# Patient Record
Sex: Female | Born: 1935
Health system: Southern US, Community
[De-identification: ages and names within clinical notes are randomized; demographics above are authoritative.]

## PROBLEM LIST (undated history)

## (undated) DIAGNOSIS — T8859XA Other complications of anesthesia, initial encounter: Secondary | ICD-10-CM

## (undated) DIAGNOSIS — I10 Essential (primary) hypertension: Secondary | ICD-10-CM

## (undated) DIAGNOSIS — M1990 Unspecified osteoarthritis, unspecified site: Secondary | ICD-10-CM

## (undated) DIAGNOSIS — T4145XA Adverse effect of unspecified anesthetic, initial encounter: Secondary | ICD-10-CM

## (undated) DIAGNOSIS — E785 Hyperlipidemia, unspecified: Secondary | ICD-10-CM

## (undated) DIAGNOSIS — I6529 Occlusion and stenosis of unspecified carotid artery: Secondary | ICD-10-CM

## (undated) HISTORY — PX: BACK SURGERY: SHX140

## (undated) HISTORY — DX: Essential (primary) hypertension: I10

## (undated) HISTORY — DX: Hyperlipidemia, unspecified: E78.5

## (undated) HISTORY — PX: CAROTID STENT: SHX1301

## (undated) HISTORY — PX: TOTAL SHOULDER REPLACEMENT: SUR1217

## (undated) HISTORY — DX: Occlusion and stenosis of unspecified carotid artery: I65.29

## (undated) HISTORY — PX: TONSILLECTOMY: SUR1361

## (undated) HISTORY — PX: JOINT REPLACEMENT: SHX530

## (undated) HISTORY — PX: REPLACEMENT TOTAL HIP W/  RESURFACING IMPLANTS: SUR1222

## (undated) HISTORY — PX: CATARACT EXTRACTION, BILATERAL: SHX1313

---

## 1998-05-29 ENCOUNTER — Ambulatory Visit (HOSPITAL_COMMUNITY): Admission: RE | Admit: 1998-05-29 | Discharge: 1998-05-29 | Payer: Self-pay | Admitting: Neurosurgery

## 2004-08-13 ENCOUNTER — Other Ambulatory Visit: Payer: Self-pay

## 2004-08-13 ENCOUNTER — Inpatient Hospital Stay: Payer: Self-pay | Admitting: Internal Medicine

## 2004-10-07 ENCOUNTER — Ambulatory Visit: Payer: Self-pay | Admitting: Internal Medicine

## 2004-12-15 ENCOUNTER — Ambulatory Visit: Payer: Self-pay | Admitting: Internal Medicine

## 2005-05-11 ENCOUNTER — Ambulatory Visit: Payer: Self-pay | Admitting: Internal Medicine

## 2005-05-18 ENCOUNTER — Ambulatory Visit: Payer: Self-pay | Admitting: Internal Medicine

## 2005-11-16 ENCOUNTER — Ambulatory Visit: Payer: Self-pay | Admitting: Unknown Physician Specialty

## 2005-12-27 ENCOUNTER — Ambulatory Visit: Payer: Self-pay | Admitting: Internal Medicine

## 2007-01-11 ENCOUNTER — Ambulatory Visit: Payer: Self-pay | Admitting: Internal Medicine

## 2007-05-23 ENCOUNTER — Ambulatory Visit: Payer: Self-pay | Admitting: Internal Medicine

## 2007-06-06 ENCOUNTER — Ambulatory Visit: Payer: Self-pay | Admitting: Surgery

## 2007-06-12 ENCOUNTER — Inpatient Hospital Stay: Payer: Self-pay | Admitting: Surgery

## 2007-07-21 ENCOUNTER — Ambulatory Visit: Payer: Self-pay | Admitting: Specialist

## 2007-07-27 ENCOUNTER — Ambulatory Visit: Payer: Self-pay | Admitting: Specialist

## 2007-08-16 ENCOUNTER — Ambulatory Visit: Payer: Self-pay | Admitting: Specialist

## 2008-01-17 ENCOUNTER — Ambulatory Visit: Payer: Self-pay | Admitting: Internal Medicine

## 2009-08-29 ENCOUNTER — Inpatient Hospital Stay: Payer: Self-pay | Admitting: Orthopedic Surgery

## 2009-09-04 ENCOUNTER — Encounter: Payer: Self-pay | Admitting: Internal Medicine

## 2009-12-28 ENCOUNTER — Ambulatory Visit: Payer: Self-pay | Admitting: Orthopedic Surgery

## 2010-03-11 ENCOUNTER — Ambulatory Visit (HOSPITAL_COMMUNITY): Admission: RE | Admit: 2010-03-11 | Discharge: 2010-03-12 | Payer: Self-pay | Admitting: Orthopedic Surgery

## 2010-03-15 ENCOUNTER — Inpatient Hospital Stay: Payer: Self-pay | Admitting: Orthopedic Surgery

## 2010-03-20 ENCOUNTER — Encounter: Payer: Self-pay | Admitting: Internal Medicine

## 2010-04-02 ENCOUNTER — Encounter: Payer: Self-pay | Admitting: Internal Medicine

## 2010-12-20 LAB — URINE MICROSCOPIC-ADD ON

## 2010-12-20 LAB — URINALYSIS, ROUTINE W REFLEX MICROSCOPIC
Bilirubin Urine: NEGATIVE
Glucose, UA: NEGATIVE mg/dL
Hgb urine dipstick: NEGATIVE
Ketones, ur: NEGATIVE mg/dL
Nitrite: POSITIVE — AB
Protein, ur: NEGATIVE mg/dL
Specific Gravity, Urine: 1.016 (ref 1.005–1.030)
Urobilinogen, UA: 0.2 mg/dL (ref 0.0–1.0)
pH: 6.5 (ref 5.0–8.0)

## 2010-12-20 LAB — COMPREHENSIVE METABOLIC PANEL
ALT: 19 U/L (ref 0–35)
AST: 24 U/L (ref 0–37)
Albumin: 4.1 g/dL (ref 3.5–5.2)
Alkaline Phosphatase: 93 U/L (ref 39–117)
BUN: 15 mg/dL (ref 6–23)
CO2: 28 mEq/L (ref 19–32)
Calcium: 9.7 mg/dL (ref 8.4–10.5)
Chloride: 103 mEq/L (ref 96–112)
Creatinine, Ser: 0.66 mg/dL (ref 0.4–1.2)
GFR calc Af Amer: >60 mL/min (ref >60)
GFR calc non Af Amer: >60 mL/min (ref >60)
Glucose, Bld: 102 mg/dL — ABNORMAL HIGH (ref 70–99)
Potassium: 4.3 mEq/L (ref 3.5–5.1)
Sodium: 137 mEq/L (ref 135–145)
Total Bilirubin: 0.4 mg/dL (ref 0.3–1.2)
Total Protein: 7.4 g/dL (ref 6.0–8.3)

## 2010-12-20 LAB — CBC
HCT: 43.2 % (ref 36.0–46.0)
Hemoglobin: 14.3 g/dL (ref 12.0–15.0)
MCHC: 33.1 g/dL (ref 30.0–36.0)
MCV: 87.9 fL (ref 78.0–100.0)
Platelets: 246 10*3/uL (ref 150–400)
RBC: 4.91 MIL/uL (ref 3.87–5.11)
RDW: 14.7 % (ref 11.5–15.5)
WBC: 9.5 10*3/uL (ref 4.0–10.5)

## 2010-12-20 LAB — APTT: aPTT: 26 seconds (ref 24–37)

## 2010-12-20 LAB — PROTIME-INR
INR: 1.01 (ref 0.00–1.49)
Prothrombin Time: 13.2 seconds (ref 11.6–15.2)

## 2011-03-27 ENCOUNTER — Ambulatory Visit: Payer: Self-pay | Admitting: Orthopedic Surgery

## 2011-04-07 ENCOUNTER — Ambulatory Visit: Payer: Self-pay | Admitting: Oncology

## 2011-04-08 LAB — CEA: CEA: 1.4 ng/mL (ref 0.0–4.7)

## 2011-04-11 LAB — PROT IMMUNOELECTROPHORES(ARMC)

## 2011-05-04 ENCOUNTER — Ambulatory Visit: Payer: Self-pay | Admitting: Oncology

## 2015-10-28 DIAGNOSIS — I10 Essential (primary) hypertension: Secondary | ICD-10-CM | POA: Diagnosis not present

## 2015-10-28 DIAGNOSIS — E785 Hyperlipidemia, unspecified: Secondary | ICD-10-CM | POA: Diagnosis not present

## 2015-11-03 DIAGNOSIS — I10 Essential (primary) hypertension: Secondary | ICD-10-CM | POA: Diagnosis not present

## 2015-11-03 DIAGNOSIS — E785 Hyperlipidemia, unspecified: Secondary | ICD-10-CM | POA: Diagnosis not present

## 2015-11-25 DIAGNOSIS — H401192 Primary open-angle glaucoma, unspecified eye, moderate stage: Secondary | ICD-10-CM | POA: Diagnosis not present

## 2015-12-02 DIAGNOSIS — H401132 Primary open-angle glaucoma, bilateral, moderate stage: Secondary | ICD-10-CM | POA: Diagnosis not present

## 2016-03-08 DIAGNOSIS — E785 Hyperlipidemia, unspecified: Secondary | ICD-10-CM | POA: Diagnosis not present

## 2016-03-08 DIAGNOSIS — I10 Essential (primary) hypertension: Secondary | ICD-10-CM | POA: Diagnosis not present

## 2016-05-10 DIAGNOSIS — I25118 Atherosclerotic heart disease of native coronary artery with other forms of angina pectoris: Secondary | ICD-10-CM | POA: Diagnosis not present

## 2016-05-10 DIAGNOSIS — I10 Essential (primary) hypertension: Secondary | ICD-10-CM | POA: Diagnosis not present

## 2016-05-10 DIAGNOSIS — R42 Dizziness and giddiness: Secondary | ICD-10-CM | POA: Diagnosis not present

## 2016-05-10 DIAGNOSIS — I6523 Occlusion and stenosis of bilateral carotid arteries: Secondary | ICD-10-CM | POA: Diagnosis not present

## 2016-05-10 DIAGNOSIS — I2510 Atherosclerotic heart disease of native coronary artery without angina pectoris: Secondary | ICD-10-CM | POA: Insufficient documentation

## 2016-05-11 DIAGNOSIS — H401132 Primary open-angle glaucoma, bilateral, moderate stage: Secondary | ICD-10-CM | POA: Diagnosis not present

## 2016-05-16 DIAGNOSIS — R002 Palpitations: Secondary | ICD-10-CM | POA: Diagnosis not present

## 2016-06-02 DIAGNOSIS — I25118 Atherosclerotic heart disease of native coronary artery with other forms of angina pectoris: Secondary | ICD-10-CM | POA: Diagnosis not present

## 2016-06-02 DIAGNOSIS — I6523 Occlusion and stenosis of bilateral carotid arteries: Secondary | ICD-10-CM | POA: Diagnosis not present

## 2016-06-08 DIAGNOSIS — I6523 Occlusion and stenosis of bilateral carotid arteries: Secondary | ICD-10-CM | POA: Diagnosis not present

## 2016-06-08 DIAGNOSIS — I10 Essential (primary) hypertension: Secondary | ICD-10-CM | POA: Diagnosis not present

## 2016-06-08 DIAGNOSIS — I2510 Atherosclerotic heart disease of native coronary artery without angina pectoris: Secondary | ICD-10-CM | POA: Diagnosis not present

## 2016-06-24 DIAGNOSIS — I10 Essential (primary) hypertension: Secondary | ICD-10-CM | POA: Diagnosis not present

## 2016-06-24 DIAGNOSIS — E785 Hyperlipidemia, unspecified: Secondary | ICD-10-CM | POA: Diagnosis not present

## 2016-06-24 DIAGNOSIS — Z23 Encounter for immunization: Secondary | ICD-10-CM | POA: Diagnosis not present

## 2016-07-12 DIAGNOSIS — E785 Hyperlipidemia, unspecified: Secondary | ICD-10-CM | POA: Diagnosis not present

## 2016-07-12 DIAGNOSIS — I10 Essential (primary) hypertension: Secondary | ICD-10-CM | POA: Diagnosis not present

## 2016-10-11 DIAGNOSIS — K2970 Gastritis, unspecified, without bleeding: Secondary | ICD-10-CM | POA: Diagnosis not present

## 2016-12-07 DIAGNOSIS — E785 Hyperlipidemia, unspecified: Secondary | ICD-10-CM | POA: Diagnosis not present

## 2016-12-07 DIAGNOSIS — I10 Essential (primary) hypertension: Secondary | ICD-10-CM | POA: Diagnosis not present

## 2016-12-22 DIAGNOSIS — I6523 Occlusion and stenosis of bilateral carotid arteries: Secondary | ICD-10-CM | POA: Diagnosis not present

## 2016-12-22 DIAGNOSIS — E782 Mixed hyperlipidemia: Secondary | ICD-10-CM | POA: Diagnosis not present

## 2016-12-22 DIAGNOSIS — I10 Essential (primary) hypertension: Secondary | ICD-10-CM | POA: Diagnosis not present

## 2016-12-22 DIAGNOSIS — I2510 Atherosclerotic heart disease of native coronary artery without angina pectoris: Secondary | ICD-10-CM | POA: Diagnosis not present

## 2017-01-03 DIAGNOSIS — E785 Hyperlipidemia, unspecified: Secondary | ICD-10-CM | POA: Diagnosis not present

## 2017-01-03 DIAGNOSIS — I10 Essential (primary) hypertension: Secondary | ICD-10-CM | POA: Diagnosis not present

## 2017-01-10 DIAGNOSIS — H401132 Primary open-angle glaucoma, bilateral, moderate stage: Secondary | ICD-10-CM | POA: Diagnosis not present

## 2017-01-17 DIAGNOSIS — H401132 Primary open-angle glaucoma, bilateral, moderate stage: Secondary | ICD-10-CM | POA: Diagnosis not present

## 2017-06-07 DIAGNOSIS — E785 Hyperlipidemia, unspecified: Secondary | ICD-10-CM | POA: Diagnosis not present

## 2017-06-07 DIAGNOSIS — I10 Essential (primary) hypertension: Secondary | ICD-10-CM | POA: Diagnosis not present

## 2017-06-20 DIAGNOSIS — I10 Essential (primary) hypertension: Secondary | ICD-10-CM | POA: Diagnosis not present

## 2017-06-20 DIAGNOSIS — E782 Mixed hyperlipidemia: Secondary | ICD-10-CM | POA: Diagnosis not present

## 2017-06-20 DIAGNOSIS — I2510 Atherosclerotic heart disease of native coronary artery without angina pectoris: Secondary | ICD-10-CM | POA: Diagnosis not present

## 2017-06-20 DIAGNOSIS — I6523 Occlusion and stenosis of bilateral carotid arteries: Secondary | ICD-10-CM | POA: Diagnosis not present

## 2017-06-27 DIAGNOSIS — I10 Essential (primary) hypertension: Secondary | ICD-10-CM | POA: Diagnosis not present

## 2017-06-27 DIAGNOSIS — M5134 Other intervertebral disc degeneration, thoracic region: Secondary | ICD-10-CM | POA: Diagnosis not present

## 2017-06-27 DIAGNOSIS — Z23 Encounter for immunization: Secondary | ICD-10-CM | POA: Diagnosis not present

## 2017-06-27 DIAGNOSIS — E785 Hyperlipidemia, unspecified: Secondary | ICD-10-CM | POA: Diagnosis not present

## 2017-09-19 DIAGNOSIS — H401132 Primary open-angle glaucoma, bilateral, moderate stage: Secondary | ICD-10-CM | POA: Diagnosis not present

## 2017-11-29 DIAGNOSIS — E785 Hyperlipidemia, unspecified: Secondary | ICD-10-CM | POA: Diagnosis not present

## 2017-11-29 DIAGNOSIS — I10 Essential (primary) hypertension: Secondary | ICD-10-CM | POA: Diagnosis not present

## 2017-12-05 DIAGNOSIS — E785 Hyperlipidemia, unspecified: Secondary | ICD-10-CM | POA: Diagnosis not present

## 2017-12-05 DIAGNOSIS — M5134 Other intervertebral disc degeneration, thoracic region: Secondary | ICD-10-CM | POA: Diagnosis not present

## 2017-12-05 DIAGNOSIS — I10 Essential (primary) hypertension: Secondary | ICD-10-CM | POA: Diagnosis not present

## 2017-12-26 DIAGNOSIS — I10 Essential (primary) hypertension: Secondary | ICD-10-CM | POA: Diagnosis not present

## 2017-12-26 DIAGNOSIS — I6523 Occlusion and stenosis of bilateral carotid arteries: Secondary | ICD-10-CM | POA: Diagnosis not present

## 2017-12-26 DIAGNOSIS — I2510 Atherosclerotic heart disease of native coronary artery without angina pectoris: Secondary | ICD-10-CM | POA: Diagnosis not present

## 2017-12-26 DIAGNOSIS — E782 Mixed hyperlipidemia: Secondary | ICD-10-CM | POA: Diagnosis not present

## 2017-12-26 DIAGNOSIS — R42 Dizziness and giddiness: Secondary | ICD-10-CM | POA: Diagnosis not present

## 2018-01-29 DIAGNOSIS — N390 Urinary tract infection, site not specified: Secondary | ICD-10-CM | POA: Diagnosis not present

## 2018-02-06 DIAGNOSIS — I2510 Atherosclerotic heart disease of native coronary artery without angina pectoris: Secondary | ICD-10-CM | POA: Diagnosis not present

## 2018-02-06 DIAGNOSIS — I6523 Occlusion and stenosis of bilateral carotid arteries: Secondary | ICD-10-CM | POA: Diagnosis not present

## 2018-02-06 DIAGNOSIS — H401132 Primary open-angle glaucoma, bilateral, moderate stage: Secondary | ICD-10-CM | POA: Diagnosis not present

## 2018-02-06 DIAGNOSIS — I10 Essential (primary) hypertension: Secondary | ICD-10-CM | POA: Diagnosis not present

## 2018-02-06 DIAGNOSIS — R42 Dizziness and giddiness: Secondary | ICD-10-CM | POA: Diagnosis not present

## 2018-02-20 DIAGNOSIS — H401132 Primary open-angle glaucoma, bilateral, moderate stage: Secondary | ICD-10-CM | POA: Diagnosis not present

## 2018-03-08 ENCOUNTER — Encounter (INDEPENDENT_AMBULATORY_CARE_PROVIDER_SITE_OTHER): Payer: Self-pay | Admitting: Vascular Surgery

## 2018-03-08 ENCOUNTER — Ambulatory Visit (INDEPENDENT_AMBULATORY_CARE_PROVIDER_SITE_OTHER): Payer: PPO | Admitting: Vascular Surgery

## 2018-03-08 DIAGNOSIS — I10 Essential (primary) hypertension: Secondary | ICD-10-CM | POA: Diagnosis not present

## 2018-03-08 DIAGNOSIS — I6523 Occlusion and stenosis of bilateral carotid arteries: Secondary | ICD-10-CM

## 2018-03-08 DIAGNOSIS — E785 Hyperlipidemia, unspecified: Secondary | ICD-10-CM | POA: Insufficient documentation

## 2018-03-08 DIAGNOSIS — I2510 Atherosclerotic heart disease of native coronary artery without angina pectoris: Secondary | ICD-10-CM | POA: Insufficient documentation

## 2018-03-08 DIAGNOSIS — I25118 Atherosclerotic heart disease of native coronary artery with other forms of angina pectoris: Secondary | ICD-10-CM | POA: Diagnosis not present

## 2018-03-08 DIAGNOSIS — I6529 Occlusion and stenosis of unspecified carotid artery: Secondary | ICD-10-CM | POA: Insufficient documentation

## 2018-03-08 DIAGNOSIS — E782 Mixed hyperlipidemia: Secondary | ICD-10-CM | POA: Diagnosis not present

## 2018-03-08 NOTE — Progress Notes (Signed)
MRN : [id number]  Cindy Mcdonald is a 84 y.o. (Apr 04, 1930) female who presents with chief complaint of  Chief Complaint  Patient presents with  . New Patient (Initial Visit)    CAD  .  History of Present Illness:   The patient is seen for evaluation of carotid stenosis. The carotid stenosis was identified years ago and has been follow by duplex ultrasound.  She is s/p remote right CEA which was without any significant complications.  Her most recent duplex now shows a >80% lesion of the left ICA.  The patient denies amaurosis fugax. There is no recent history of TIA symptoms or focal motor deficits. There is no prior documented CVA.  There is no history of migraine headaches. There is no history of seizures.  The patient is taking enteric-coated aspirin 81 mg daily.  The patient has a history of coronary artery disease, no recent episodes of angina or shortness of breath. The patient denies PAD or claudication symptoms. There is a history of hyperlipidemia which is being treated with a statin.    Current Meds  Medication Sig  . amLODipine (NORVASC) 5 MG tablet TAKE ONE TABLET EVERY DAY  . aspirin EC 81 MG tablet Take by mouth.  . dorzolamide-timolol (COSOPT) 22.3-6.8 MG/ML ophthalmic solution INSTILL 1 DROP TO BOTH EYES 2 TIMES A DAY  . Garlic 4742 MG CAPS Take by mouth.  . latanoprost (XALATAN) 0.005 % ophthalmic solution   . losartan (COZAAR) 25 MG tablet Take by mouth.  . niacin 500 MG tablet Take 500 mg by mouth at bedtime.  . Omega-3 Fatty Acids (FISH OIL) 1000 MG CPDR Take by mouth.    Past Medical History:  Diagnosis Date  . Carotid artery occlusion   . Carotid artery occlusion   . Hyperlipidemia   . Hypertension      Social History Social History   Tobacco Use  . Smoking status: Never Smoker  . Smokeless tobacco: Never Used  Substance Use Topics  . Alcohol use: Not Currently  . Drug use: Not on file    Family History Family History  Problem  Relation Age of Onset  . Hypertension Mother   . Heart disease Father   No family history of bleeding/clotting disorders, porphyria or autoimmune disease   Allergies  Allergen Reactions  . Codeine     Other reaction(s): Unknown  . Nsaids     Other reaction(s): Unknown  . Statins     Other reaction(s): Unknown     REVIEW OF SYSTEMS (Negative unless checked)  Constitutional: '[]' Weight loss  '[]' Fever  '[]' Chills Cardiac: '[]' Chest pain   '[]' Chest pressure   '[]' Palpitations   '[]' Shortness of breath when laying flat   '[]' Shortness of breath with exertion. Vascular:  '[]' Pain in legs with walking   '[]' Pain in legs at rest  '[]' History of DVT   '[]' Phlebitis   '[]' Swelling in legs   '[]' Varicose veins   '[]' Non-healing ulcers Pulmonary:   '[]' Uses home oxygen   '[]' Productive cough   '[]' Hemoptysis   '[]' Wheeze  '[]' COPD   '[]' Asthma Neurologic:  '[x]' Dizziness   '[]' Seizures   '[]' History of stroke   '[]' History of TIA  '[]' Aphasia   '[]' Vissual changes   '[]' Weakness or numbness in arm   '[]' Weakness or numbness in leg Musculoskeletal:   '[]' Joint swelling   '[]' Joint pain   '[]' Low back pain Hematologic:  '[]' Easy bruising  '[]' Easy bleeding   '[]' Hypercoagulable state   '[]' Anemic Gastrointestinal:  '[]' Diarrhea   '[]' Vomiting  '[]' Gastroesophageal reflux/heartburn   '[]'   Difficulty swallowing. Genitourinary:  '[]' Chronic kidney disease   '[]' Difficult urination  '[]' Frequent urination   '[]' Blood in urine Skin:  '[]' Rashes   '[]' Ulcers  Psychological:  '[]' History of anxiety   '[]'  History of major depression.  Physical Examination  Vitals:   03/08/18 1308  BP: (!) 157/100  Pulse: 84  Resp: 15  Weight: 129 lb (58.5 kg)  Height: '5\' 4"'  (1.626 m)   Body mass index is 22.14 kg/m. Gen: WD/WN, NAD Head: [place]/AT, No temporalis wasting.  Ear/Nose/Throat: Hearing grossly intact, nares w/o erythema or drainage, poor dentition Eyes: PER, EOMI, sclera nonicteric.  Neck: Supple, no masses.  No bruit or JVD.  Pulmonary:  Good air movement, clear to auscultation bilaterally,  no use of accessory muscles.  Cardiac: RRR, normal S1, S2, no Murmurs. Vascular: right CEA incisional scar well healed.  Bilateral carotid bruits Vessel Right Left  Radial Palpable Palpable  Ulnar Palpable Palpable  Brachial Palpable Palpable  Carotid Palpable Palpable  Gastrointestinal: soft, non-distended. No guarding/no peritoneal signs.  Musculoskeletal: M/S 5/5 throughout.  No deformity or atrophy.  Neurologic: CN 2-12 intact. Pain and light touch intact in extremities.  Symmetrical.  Speech is fluent. Motor exam as listed above. Psychiatric: Judgment intact, Mood & affect appropriate for pt's clinical situation. Dermatologic: No rashes or ulcers noted.  No changes consistent with cellulitis. Lymph : No Cervical lymphadenopathy, no lichenification or skin changes of chronic lymphedema.  CBC Lab Results  Component Value Date   WBC 9.5 03/08/2010   HGB 14.3 03/08/2010   HCT 43.2 03/08/2010   MCV 87.9 03/08/2010   PLT 246 03/08/2010    BMET    Component Value Date/Time   NA 137 03/08/2010 1054   K 4.3 03/08/2010 1054   CL 103 03/08/2010 1054   CO2 28 03/08/2010 1054   GLUCOSE 102 (H) 03/08/2010 1054   BUN 15 03/08/2010 1054   CREATININE 0.66 03/08/2010 1054   CALCIUM 9.7 03/08/2010 1054   GFRNONAA >60 03/08/2010 1054   GFRAA  03/08/2010 1054    >60        The eGFR has been calculated using the MDRD equation. This calculation has not been validated in all clinical situations. eGFR's persistently <60 mL/min signify possible Chronic Kidney Disease.   CrCl cannot be calculated (Patient's most recent lab result is older than the maximum 21 days allowed.).  COAG Lab Results  Component Value Date   INR 1.01 03/08/2010    Radiology No results found.    Assessment/Plan 1. Bilateral carotid artery stenosis The patient remains asymptomatic with respect to the carotid stenosis.  However, the patient has now progressed and has a LICA lesion the is >90%.  Patient  should undergo CT angiography of the carotid arteries to define the degree of stenosis of the internal carotid arteries bilaterally and the anatomic suitability for surgery vs. intervention.  If the patient does indeed need surgery cardiac clearance will be required, once cleared the patient will be scheduled for surgery.  The risks, benefits and alternative therapies were reviewed in detail with the patient.  All questions were answered.  The patient agrees to proceed with imaging.  Continue antiplatelet therapy as prescribed. Continue management of CAD, HTN and Hyperlipidemia. Healthy heart diet, encouraged exercise at least 4 times per week.   - CT ANGIO NECK W OR WO CONTRAST; Future  2. Essential hypertension Continue antihypertensive medications as already ordered, these medications have been reviewed and there are no changes at this time.   3.  Coronary artery disease of native artery of native heart with stable angina pectoris (HCC) Continue cardiac and antihypertensive medications as already ordered and reviewed, no changes at this time.  Continue statin as ordered and reviewed, no changes at this time  Nitrates PRN for chest pain   4. Mixed hyperlipidemia Continue statin as ordered and reviewed, no changes at this time     Hortencia Pilar, MD  03/08/2018 1:28 PM

## 2018-03-22 ENCOUNTER — Ambulatory Visit
Admission: RE | Admit: 2018-03-22 | Discharge: 2018-03-22 | Disposition: A | Discharge status: Home / Self Care | Payer: PPO | Source: Ambulatory Visit | Attending: Vascular Surgery | Admitting: Vascular Surgery

## 2018-03-22 DIAGNOSIS — I700 Atherosclerosis of aorta: Secondary | ICD-10-CM | POA: Diagnosis not present

## 2018-03-22 DIAGNOSIS — I6522 Occlusion and stenosis of left carotid artery: Secondary | ICD-10-CM | POA: Diagnosis not present

## 2018-03-22 DIAGNOSIS — I6523 Occlusion and stenosis of bilateral carotid arteries: Secondary | ICD-10-CM | POA: Insufficient documentation

## 2018-03-22 LAB — POCT I-STAT CREATININE: Creatinine, Ser: 1 mg/dL (ref 0.44–1.00)

## 2018-03-22 MED ORDER — IOPAMIDOL (ISOVUE-370) INJECTION 76%
75.0000 mL | Freq: Once | INTRAVENOUS | Status: AC | PRN
  Administered 2018-03-22: 75 mL via INTRAVENOUS

## 2018-04-16 ENCOUNTER — Ambulatory Visit (INDEPENDENT_AMBULATORY_CARE_PROVIDER_SITE_OTHER): Payer: PPO | Admitting: Vascular Surgery

## 2018-04-16 ENCOUNTER — Encounter (INDEPENDENT_AMBULATORY_CARE_PROVIDER_SITE_OTHER): Payer: Self-pay | Admitting: Vascular Surgery

## 2018-04-16 VITALS — BP 152/78 | HR 83 | Resp 16 | Ht 64.0 in | Wt 132.0 lb

## 2018-04-16 DIAGNOSIS — E782 Mixed hyperlipidemia: Secondary | ICD-10-CM

## 2018-04-16 DIAGNOSIS — I25118 Atherosclerotic heart disease of native coronary artery with other forms of angina pectoris: Secondary | ICD-10-CM | POA: Diagnosis not present

## 2018-04-16 DIAGNOSIS — I6523 Occlusion and stenosis of bilateral carotid arteries: Secondary | ICD-10-CM | POA: Diagnosis not present

## 2018-04-16 DIAGNOSIS — I10 Essential (primary) hypertension: Secondary | ICD-10-CM | POA: Diagnosis not present

## 2018-04-18 ENCOUNTER — Encounter (INDEPENDENT_AMBULATORY_CARE_PROVIDER_SITE_OTHER): Payer: Self-pay

## 2018-04-18 ENCOUNTER — Encounter (INDEPENDENT_AMBULATORY_CARE_PROVIDER_SITE_OTHER): Payer: Self-pay | Admitting: Vascular Surgery

## 2018-04-18 ENCOUNTER — Telehealth (INDEPENDENT_AMBULATORY_CARE_PROVIDER_SITE_OTHER): Payer: Self-pay

## 2018-04-18 NOTE — Telephone Encounter (Signed)
Spoke with patient's daughter and she is scheduled for her surgery.

## 2018-04-18 NOTE — Progress Notes (Signed)
MRN : [id number]  Cindy Mcdonald is a 84 y.o. ([birth year]) female who presents with chief complaint of  Chief Complaint  Patient presents with  . Follow-up    ct results  .  History of Present Illness: The patient is seen for follow up evaluation of carotid stenosis status post CT angiogram. CT scan was done 03/22/2018. Patient reports that the test went well with no problems or complications.   The patient denies interval amaurosis fugax. There is no recent or interval TIA symptoms or focal motor deficits. There is no prior documented CVA.  The patient is taking enteric-coated aspirin 81 mg daily.  There is no history of migraine headaches. There is no history of seizures.  The patient has a history of coronary artery disease, no recent episodes of angina or shortness of breath. The patient denies PAD or claudication symptoms. There is a history of hyperlipidemia which is being treated with a statin.    CT angiogram is reviewed by me personally and shows greater that 90% left internal carotid artery stenosis and a widely patent right carotid system (she has a history of right carotid endarterectomy remotely) vertebral arteries are widely patent.    Current Meds  Medication Sig  . amLODipine (NORVASC) 5 MG tablet TAKE ONE TABLET EVERY DAY  . aspirin EC 81 MG tablet Take by mouth.  . dorzolamide-timolol (COSOPT) 22.3-6.8 MG/ML ophthalmic solution INSTILL 1 DROP TO BOTH EYES 2 TIMES A DAY  . Garlic 6269 MG CAPS Take by mouth.  . latanoprost (XALATAN) 0.005 % ophthalmic solution   . losartan (COZAAR) 25 MG tablet Take by mouth.  . niacin 500 MG tablet Take 500 mg by mouth at bedtime.  . Omega-3 Fatty Acids (FISH OIL) 1000 MG CPDR Take by mouth.    Past Medical History:  Diagnosis Date  . Carotid artery occlusion   . Carotid artery occlusion   . Hyperlipidemia   . Hypertension     Past Surgical History:  Procedure Laterality Date  . CAROTID STENT    . REPLACEMENT TOTAL  HIP W/  RESURFACING IMPLANTS     times two  . TOTAL SHOULDER REPLACEMENT Right     Social History Social History   Tobacco Use  . Smoking status: Never Smoker  . Smokeless tobacco: Never Used  Substance Use Topics  . Alcohol use: Not Currently  . Drug use: Not on file    Family History Family History  Problem Relation Age of Onset  . Hypertension Mother   . Heart disease Father     Allergies  Allergen Reactions  . Codeine     Other reaction(s): Unknown  . Nsaids     Other reaction(s): Unknown  . Statins     Other reaction(s): Unknown     REVIEW OF SYSTEMS (Negative unless checked)  Constitutional: '[]' Weight loss  '[]' Fever  '[]' Chills Cardiac: '[]' Chest pain   '[]' Chest pressure   '[]' Palpitations   '[]' Shortness of breath when laying flat   '[]' Shortness of breath with exertion. Vascular:  '[]' Pain in legs with walking   '[]' Pain in legs at rest  '[]' History of DVT   '[]' Phlebitis   '[]' Swelling in legs   '[]' Varicose veins   '[]' Non-healing ulcers Pulmonary:   '[]' Uses home oxygen   '[]' Productive cough   '[]' Hemoptysis   '[]' Wheeze  '[]' COPD   '[]' Asthma Neurologic:  '[]' Dizziness   '[]' Seizures   '[]' History of stroke   '[]' History of TIA  '[]' Aphasia   '[]' Vissual changes   '[]' Weakness or  numbness in arm   '[]' Weakness or numbness in leg Musculoskeletal:   '[]' Joint swelling   '[]' Joint pain   '[]' Low back pain Hematologic:  '[]' Easy bruising  '[]' Easy bleeding   '[]' Hypercoagulable state   '[]' Anemic Gastrointestinal:  '[]' Diarrhea   '[]' Vomiting  '[]' Gastroesophageal reflux/heartburn   '[]' Difficulty swallowing. Genitourinary:  '[]' Chronic kidney disease   '[]' Difficult urination  '[]' Frequent urination   '[]' Blood in urine Skin:  '[]' Rashes   '[]' Ulcers  Psychological:  '[]' History of anxiety   '[]'  History of major depression.  Physical Examination  Vitals:   04/16/18 1409  BP: (!) 152/78  Pulse: 83  Resp: 16  Weight: 132 lb (59.9 kg)  Height: '5\' 4"'  (1.626 m)   Body mass index is 22.66 kg/m. Gen: WD/WN, NAD Head: [place]/AT, No temporalis  wasting.  Ear/Nose/Throat: Hearing grossly intact, nares w/o erythema or drainage Eyes: PER, EOMI, sclera nonicteric.  Neck: Supple, no large masses.   Pulmonary:  Good air movement, no audible wheezing bilaterally, no use of accessory muscles.  Cardiac: RRR, no JVD Vascular: Well-healed right carotid endarterectomy incisional scar, left carotid bruit Vessel Right Left  Radial Palpable Palpable  Ulnar Palpable Palpable  Brachial Palpable Palpable  Carotid Palpable Palpable  Gastrointestinal: Non-distended. No guarding/no peritoneal signs.  Musculoskeletal: M/S 5/5 throughout.  No deformity or atrophy.  Neurologic: CN 2-12 intact. Symmetrical.  Speech is fluent. Motor exam as listed above. Psychiatric: Judgment intact, Mood & affect appropriate for pt's clinical situation. Dermatologic: No rashes or ulcers noted.  No changes consistent with cellulitis. Lymph : No lichenification or skin changes of chronic lymphedema.  CBC Lab Results  Component Value Date   WBC 9.5 03/08/2010   HGB 14.3 03/08/2010   HCT 43.2 03/08/2010   MCV 87.9 03/08/2010   PLT 246 03/08/2010    BMET    Component Value Date/Time   NA 137 03/08/2010 1054   K 4.3 03/08/2010 1054   CL 103 03/08/2010 1054   CO2 28 03/08/2010 1054   GLUCOSE 102 (H) 03/08/2010 1054   BUN 15 03/08/2010 1054   CREATININE 1.00 03/22/2018 1430   CALCIUM 9.7 03/08/2010 1054   GFRNONAA >60 03/08/2010 1054   GFRAA  03/08/2010 1054    >60        The eGFR has been calculated using the MDRD equation. This calculation has not been validated in all clinical situations. eGFR's persistently <60 mL/min signify possible Chronic Kidney Disease.   CrCl cannot be calculated (Patient's most recent lab result is older than the maximum 21 days allowed.).  COAG Lab Results  Component Value Date   INR 1.01 03/08/2010    Radiology Ct Angio Neck W Or Wo Contrast  Result Date: 03/22/2018 CLINICAL DATA:  84 year old female with history  of right carotid artery stent. Progressed left carotid atherosclerosis and stenosis since 2017 on Doppler last month. Subsequent encounter. EXAM: CT ANGIOGRAPHY NECK TECHNIQUE: Multidetector CT imaging of the neck was performed using the standard protocol during bolus administration of intravenous contrast. Multiplanar CT image reconstructions and MIPs were obtained to evaluate the vascular anatomy. Carotid stenosis measurements (when applicable) are obtained utilizing NASCET criteria, using the distal internal carotid diameter as the denominator. CONTRAST:  72m ISOVUE-370 IOPAMIDOL (ISOVUE-370) INJECTION 76% COMPARISON:  Carotid Doppler ultrasound 02/06/2018. CTA neck 05/23/2007 FINDINGS: Skeleton: Multilevel cervical spine ankylosis appears acquired. Involvement from the C2 to the C5 or C6 level. Mild upper thoracic exaggerated kyphosis and levoconvex scoliosis. Chronic appearing T3 mild compression fracture. Partially visible right shoulder arthroplasty. Chronic right sphenoid  and maxillary sinusitis with mucoperiosteal thickening and partially calcified sinus material. No acute osseous abnormality identified. Upper chest: Streak artifact related to right shoulder arthroplasty. The upper lungs appear clear. No superior mediastinal lymphadenopathy. Other neck: Mild thyroid goiter.  No neck mass or lymphadenopathy. Aortic arch: Excellent arterial contrast timing. Bulky soft greater than calcified plaque throughout the visible aorta with distal arch plaque ulceration as seen on series 10, image 130. Three vessel arch configuration. Right carotid system: No brachiocephalic artery or right CCA origin stenosis despite soft plaque. Tortuous proximal right CCA. Normal right carotid bifurcation and cervical right ICA. No stent is identified, the patient might be status post previous right carotid endarterectomy. Left carotid system: Less than 50 % stenosis with respect to the distal vessel at the left CCA origin related  to soft plaque. Tortuous proximal left CCA. At the left carotid bifurcation there is bulky soft and also calcified plaque resulting in high-grade stenosis approaching a radiographic string sign as seen on series 8, image 112. This is numerically estimated at 72 % with respect to the distal vessel, but may be numerically underestimated. This high-grade stenosis involves a 7 millimeter segment of the left ICA bulb. Distally the cervical left ICA is widely patent and mildly tortuous. Vertebral arteries: Soft plaque at the right vertebral artery origin without significant stenosis. Mildly tortuous right V1 segment. Tortuous right V 2 segment with intermittent kinked appearance or mild narrowing, not hemodynamically significant. Bulky soft plaque in the proximal right subclavian artery as seen on series 10, image 130, but no hemodynamically significant stenosis results. There is a prominent plaque ulceration about 3 centimeters distal to the origin as seen on series 10, image 129. The left vertebral artery arises just distally and the origin is patent without stenosis. The left V1 segment is tortuous with a kinked appearance. The left V2 segment is tortuous. No left vertebral artery stenosis to the skull base. Anatomic variants: Mildly dominant left vertebral artery. Visible intracranial circulation: The distal vertebral arteries are patent. The right is mildly dominant. There are mild tandem stenoses of the distal right V4 segment beyond the patent right PICA origin. There is a severe short segment stenosis of the left V4 segment just proximal to the vertebrobasilar junction as demonstrated on series 15, image 19. Patent vertebrobasilar junction. Patent, dolichoectatic and irregular basilar artery without stenosis. The SCA and PCA origins are patent. Posterior communicating arteries are diminutive or absent. There is moderate left and up to severe right PCA branch irregularity and stenosis (series 16). Both ICA siphons  are patent with only mild calcified plaque and no significant siphon stenosis. Normal ophthalmic artery origins. Patent carotid termini. Mild irregularity and stenosis at the left MCA origin and the right M1 segment as seen on series 15, image 13. Diminutive anterior communicating artery. Review of the MIP images confirms the above findings IMPRESSION: 1. Aortic Atherosclerosis (ICD10-I70.0). Bulky soft plaque throughout the visible thoracic aorta and proximal great vessels, especially the proximal Left Subclavian Artery. Multifocal ulcerated plaque in the distal aortic arch and proximal left subclavian (series 2, images 129 and 130). 2. Positive for high-grade Left ICA bulb stenosis, numerically estimated at 72% - but approaching a Radiographic String Sign and could be numerically underestimated. 3. Normal right carotid bifurcation, probably status post prior endarterectomy. No right carotid and no ICA siphon stenosis. 4. No proximal vertebral artery stenosis, but there is severe stenosis of the distal left V4 segment just proximal to the vertebrobasilar junction. 5. Visible circle-of-Willis  branches demonstrate atherosclerosis including moderate to severe bilateral PCA stenoses and mild proximal MCA stenosis. Electronically Signed   By: Genevie Ann M.D.   On: 03/22/2018 15:06      Assessment/Plan 1. Bilateral carotid artery stenosis Recommend:  The patient remains asymptomatic with respect to the carotid stenosis.  However, the patient has now progressed and has a lesion the is >75%.  Patient's CT angiography of the carotid arteries confirms >75% left ICA stenosis.  The anatomical considerations support surgery over stenting.  This was discussed in detail with the patient.  The patient does indeed need surgery, therefore, cardiac clearance will be arranged. Once cleared the patient will be scheduled for surgery.  The risks, benefits and alternative therapies were reviewed in detail with the patient.   All questions were answered.  The patient agrees to proceed with surgery of the left internal carotid artery.  Continue antiplatelet therapy as prescribed. Continue management of CAD, HTN and Hyperlipidemia. Healthy heart diet, encouraged exercise at least 4 times per week.    2. Coronary artery disease of native artery of native heart with stable angina pectoris ([place]) Continue cardiac and antihypertensive medications as already ordered and reviewed, no changes at this time.  Continue statin as ordered and reviewed, no changes at this time  Nitrates PRN for chest pain   3. Essential hypertension Continue antihypertensive medications as already ordered, these medications have been reviewed and there are no changes at this time.   4. Mixed hyperlipidemia Continue statin as ordered and reviewed, no changes at this time     Hortencia Pilar, MD  04/18/2018 8:52 AM

## 2018-04-19 ENCOUNTER — Encounter (INDEPENDENT_AMBULATORY_CARE_PROVIDER_SITE_OTHER): Payer: Self-pay

## 2018-04-23 ENCOUNTER — Other Ambulatory Visit (INDEPENDENT_AMBULATORY_CARE_PROVIDER_SITE_OTHER): Payer: Self-pay | Admitting: Vascular Surgery

## 2018-04-23 ENCOUNTER — Other Ambulatory Visit: Payer: PPO

## 2018-04-30 ENCOUNTER — Encounter
Admission: RE | Admit: 2018-04-30 | Discharge: 2018-04-30 | Disposition: A | Discharge status: Home / Self Care | Payer: PPO | Source: Ambulatory Visit | Attending: Vascular Surgery | Admitting: Vascular Surgery

## 2018-04-30 ENCOUNTER — Other Ambulatory Visit: Payer: Self-pay

## 2018-04-30 DIAGNOSIS — Z888 Allergy status to other drugs, medicaments and biological substances status: Secondary | ICD-10-CM | POA: Diagnosis not present

## 2018-04-30 DIAGNOSIS — E785 Hyperlipidemia, unspecified: Secondary | ICD-10-CM | POA: Diagnosis not present

## 2018-04-30 DIAGNOSIS — E782 Mixed hyperlipidemia: Secondary | ICD-10-CM | POA: Diagnosis present

## 2018-04-30 DIAGNOSIS — Z01810 Encounter for preprocedural cardiovascular examination: Secondary | ICD-10-CM | POA: Diagnosis not present

## 2018-04-30 DIAGNOSIS — Z885 Allergy status to narcotic agent status: Secondary | ICD-10-CM | POA: Diagnosis not present

## 2018-04-30 DIAGNOSIS — Z96611 Presence of right artificial shoulder joint: Secondary | ICD-10-CM | POA: Diagnosis present

## 2018-04-30 DIAGNOSIS — I10 Essential (primary) hypertension: Secondary | ICD-10-CM | POA: Diagnosis present

## 2018-04-30 DIAGNOSIS — Z886 Allergy status to analgesic agent status: Secondary | ICD-10-CM | POA: Diagnosis not present

## 2018-04-30 DIAGNOSIS — I2510 Atherosclerotic heart disease of native coronary artery without angina pectoris: Secondary | ICD-10-CM | POA: Diagnosis not present

## 2018-04-30 DIAGNOSIS — I25118 Atherosclerotic heart disease of native coronary artery with other forms of angina pectoris: Secondary | ICD-10-CM | POA: Diagnosis present

## 2018-04-30 DIAGNOSIS — I6522 Occlusion and stenosis of left carotid artery: Secondary | ICD-10-CM | POA: Diagnosis present

## 2018-04-30 HISTORY — DX: Adverse effect of unspecified anesthetic, initial encounter: T41.45XA

## 2018-04-30 HISTORY — DX: Other complications of anesthesia, initial encounter: T88.59XA

## 2018-04-30 HISTORY — DX: Unspecified osteoarthritis, unspecified site: M19.90

## 2018-04-30 LAB — BASIC METABOLIC PANEL
Anion gap: 7 (ref 5–15)
BUN: 36 mg/dL — ABNORMAL HIGH (ref 8–23)
CO2: 27 mmol/L (ref 22–32)
Calcium: 10 mg/dL (ref 8.9–10.3)
Chloride: 107 mmol/L (ref 98–111)
Creatinine, Ser: 1.02 mg/dL — ABNORMAL HIGH (ref 0.44–1.00)
GFR calc Af Amer: 56 mL/min — ABNORMAL LOW (ref >60)
GFR calc non Af Amer: 48 mL/min — ABNORMAL LOW (ref >60)
Glucose, Bld: 100 mg/dL — ABNORMAL HIGH (ref 70–99)
Potassium: 4.5 mmol/L (ref 3.5–5.1)
Sodium: 141 mmol/L (ref 135–145)

## 2018-04-30 LAB — APTT: aPTT: 25 seconds (ref 24–36)

## 2018-04-30 LAB — CBC WITH DIFFERENTIAL/PLATELET
Basophils Absolute: 0.1 10*3/uL (ref 0–0.1)
Basophils Relative: 1 %
Eosinophils Absolute: 0.3 10*3/uL (ref 0–0.7)
Eosinophils Relative: 3 %
HCT: 37.2 % (ref 35.0–47.0)
Hemoglobin: 12.3 g/dL (ref 12.0–16.0)
Lymphocytes Relative: 22 %
Lymphs Abs: 2.3 10*3/uL (ref 1.0–3.6)
MCH: 28.8 pg (ref 26.0–34.0)
MCHC: 33.1 g/dL (ref 32.0–36.0)
MCV: 86.8 fL (ref 80.0–100.0)
Monocytes Absolute: 0.8 10*3/uL (ref 0.2–0.9)
Monocytes Relative: 8 %
Neutro Abs: 6.8 10*3/uL — ABNORMAL HIGH (ref 1.4–6.5)
Neutrophils Relative %: 66 %
Platelets: 243 10*3/uL (ref 150–440)
RBC: 4.29 MIL/uL (ref 3.80–5.20)
RDW: 13.9 % (ref 11.5–14.5)
WBC: 10.2 10*3/uL (ref 3.6–11.0)

## 2018-04-30 LAB — TYPE AND SCREEN
ABO/RH(D): O POS
Antibody Screen: NEGATIVE

## 2018-04-30 LAB — PROTIME-INR
INR: 1.05
Prothrombin Time: 13.6 seconds (ref 11.4–15.2)

## 2018-04-30 NOTE — Patient Instructions (Signed)
Your procedure is scheduled on: Wednesday 05/02/18 Report to [facility]. To find out your arrival time please call [phone] between 1PM - 3PM on Tuesday 05/01/18.  Remember: Instructions that are not followed completely may result in serious medical risk, up to and including death, or upon the discretion of your surgeon and anesthesiologist your surgery may need to be rescheduled.     _X__ 1. Do not eat food after midnight the night before your procedure.                 No gum chewing or hard candies. You may drink clear liquids up to 2 hours                 before you are scheduled to arrive for your surgery- DO not drink clear                 liquids within 2 hours of the start of your surgery.                 Clear Liquids include:  water, apple juice without pulp, clear carbohydrate                 drink such as Clearfast or Gatorade, Black Coffee or Tea (Do not add                 anything to coffee or tea).  __X__2.  On the morning of surgery brush your teeth with toothpaste and water, you                 may rinse your mouth with mouthwash if you wish.  Do not swallow any              toothpaste of mouthwash.     _X__ 3.  No Alcohol for 24 hours before or after surgery.   _X__ 4.  Do Not Smoke or use e-cigarettes For 24 Hours Prior to Your Surgery.                 Do not use any chewable tobacco products for at least 6 hours prior to                 surgery.  ____  5.  Bring all medications with you on the day of surgery if instructed.   __X__  6.  Notify your doctor if there is any change in your medical condition      (cold, fever, infections).     Do not wear jewelry, make-up, hairpins, clips or nail polish. Do not wear lotions, powders, or perfumes.  Do not shave 48 hours prior to surgery. Men may shave face and neck. Do not bring valuables to the hospital.    [facility] is not responsible for any belongings or  valuables.  Contacts, dentures/partials or body piercings may not be worn into surgery. Bring a case for your contacts, glasses or hearing aids, a denture cup will be supplied. Leave your suitcase in the car. After surgery it may be brought to your room. For patients admitted to the hospital, discharge time is determined by your treatment team.   Patients discharged the day of surgery will not be allowed to drive home.   Please read over the following fact sheets that you were given:   MRSA Information  __X__ Take these medicines the morning of surgery with A SIP OF WATER:  1. amLODipine (NORVASC  2.   3.   4.  5.  6.  ____ Fleet Enema (as directed)   __X__ Use CHG Soap/SAGE wipes as directed  ____ Use inhalers on the day of surgery  ____ Stop metformin/Janumet/Farxiga 2 days prior to surgery    ____ Take 1/2 of usual insulin dose the night before surgery. No insulin the morning          of surgery.   ____ Stop Blood Thinners Coumadin/Plavix/Xarelto/Pleta/Pradaxa/Eliquis/Effient/Aspirin  on   Or contact your Surgeon, Cardiologist or Medical Doctor regarding  ability to stop your blood thinners  __X__ Stop Anti-inflammatories 7 days before surgery such as Advil, Ibuprofen, Motrin,  BC or Goodies Powder, Naprosyn, Naproxen, Aleve   __X__ Stop all herbal supplements, fish oil or vitamin E until after surgery.  STOP GARLIC AND FISH OIL TODAY  ____ Bring C-Pap to the hospital.   YOU MAY CONTINUE ASPIRIN JUST DO NOT TAKE IT THE DAY OF SURGERY

## 2018-04-30 NOTE — Pre-Procedure Instructions (Signed)
Abnormal BMP results faxed to Dr Gilda CreaseSchnier office.

## 2018-05-01 MED ORDER — CEFAZOLIN SODIUM-DEXTROSE 2-4 GM/100ML-% IV SOLN
2.0000 g | INTRAVENOUS | Status: AC
  Administered 2018-05-02: 2 g via INTRAVENOUS

## 2018-05-02 ENCOUNTER — Inpatient Hospital Stay
Admission: RE | Admit: 2018-05-02 | Discharge: 2018-05-03 | DRG: 039 | Disposition: A | Discharge status: Home / Self Care | Payer: PPO | Attending: Vascular Surgery | Admitting: Vascular Surgery

## 2018-05-02 ENCOUNTER — Encounter: Payer: Self-pay | Admitting: *Deleted

## 2018-05-02 ENCOUNTER — Inpatient Hospital Stay: Payer: PPO

## 2018-05-02 ENCOUNTER — Encounter
Admission: RE | Disposition: A | Discharge status: Home / Self Care | Payer: Self-pay | Source: Home / Self Care | Attending: Vascular Surgery

## 2018-05-02 ENCOUNTER — Other Ambulatory Visit: Payer: Self-pay

## 2018-05-02 DIAGNOSIS — I10 Essential (primary) hypertension: Secondary | ICD-10-CM | POA: Diagnosis present

## 2018-05-02 DIAGNOSIS — I25118 Atherosclerotic heart disease of native coronary artery with other forms of angina pectoris: Secondary | ICD-10-CM | POA: Diagnosis present

## 2018-05-02 DIAGNOSIS — Z888 Allergy status to other drugs, medicaments and biological substances status: Secondary | ICD-10-CM

## 2018-05-02 DIAGNOSIS — Z885 Allergy status to narcotic agent status: Secondary | ICD-10-CM

## 2018-05-02 DIAGNOSIS — I6522 Occlusion and stenosis of left carotid artery: Principal | ICD-10-CM | POA: Diagnosis present

## 2018-05-02 DIAGNOSIS — E782 Mixed hyperlipidemia: Secondary | ICD-10-CM | POA: Diagnosis present

## 2018-05-02 DIAGNOSIS — Z886 Allergy status to analgesic agent status: Secondary | ICD-10-CM

## 2018-05-02 DIAGNOSIS — Z96611 Presence of right artificial shoulder joint: Secondary | ICD-10-CM | POA: Diagnosis present

## 2018-05-02 HISTORY — PX: ENDARTERECTOMY: SHX5162

## 2018-05-02 LAB — MRSA PCR SCREENING: MRSA by PCR: NEGATIVE

## 2018-05-02 LAB — GLUCOSE, CAPILLARY: Glucose-Capillary: 129 mg/dL — ABNORMAL HIGH (ref 70–99)

## 2018-05-02 LAB — ABO/RH: ABO/RH(D): O POS

## 2018-05-02 SURGERY — ENDARTERECTOMY, CAROTID
Anesthesia: General | Laterality: Left | Wound class: "Clean "

## 2018-05-02 MED ORDER — LIDOCAINE HCL (PF) 1 % IJ SOLN
INTRAMUSCULAR | Status: AC
  Filled 2018-05-02: qty 30

## 2018-05-02 MED ORDER — ACETAMINOPHEN 500 MG PO TABS: 500.0000 mg | ORAL_TABLET | Freq: Two times a day (BID) | ORAL | Status: DC | PRN

## 2018-05-02 MED ORDER — PANTOPRAZOLE SODIUM 40 MG PO TBEC
40.0000 mg | DELAYED_RELEASE_TABLET | Freq: Every day | ORAL | Status: DC
  Administered 2018-05-02 – 2018-05-03 (×2): 40 mg via ORAL
  Filled 2018-05-02 (×2): qty 1

## 2018-05-02 MED ORDER — EPHEDRINE SULFATE 50 MG/ML IJ SOLN
INTRAMUSCULAR | Status: DC | PRN
  Administered 2018-05-02: 10 mg via INTRAVENOUS
  Administered 2018-05-02: 5 mg via INTRAVENOUS
  Administered 2018-05-02: 10 mg via INTRAVENOUS

## 2018-05-02 MED ORDER — EVICEL 2 ML EX KIT
PACK | CUTANEOUS | Status: DC | PRN
  Administered 2018-05-02: 2 mL

## 2018-05-02 MED ORDER — SUGAMMADEX SODIUM 200 MG/2ML IV SOLN
INTRAVENOUS | Status: AC
  Filled 2018-05-02: qty 2

## 2018-05-02 MED ORDER — HYDRALAZINE HCL 20 MG/ML IJ SOLN: 5.0000 mg | INTRAMUSCULAR | Status: DC | PRN

## 2018-05-02 MED ORDER — DORZOLAMIDE HCL-TIMOLOL MAL 2-0.5 % OP SOLN
1.0000 [drp] | Freq: Two times a day (BID) | OPHTHALMIC | Status: DC
  Administered 2018-05-02 – 2018-05-03 (×3): 1 [drp] via OPHTHALMIC
  Filled 2018-05-02: qty 10

## 2018-05-02 MED ORDER — CHLORHEXIDINE GLUCONATE CLOTH 2 % EX PADS: 6.0000 | MEDICATED_PAD | Freq: Once | CUTANEOUS | Status: DC

## 2018-05-02 MED ORDER — SUCCINYLCHOLINE CHLORIDE 20 MG/ML IJ SOLN
INTRAMUSCULAR | Status: AC
  Filled 2018-05-02: qty 1

## 2018-05-02 MED ORDER — CEFAZOLIN SODIUM-DEXTROSE 2-4 GM/100ML-% IV SOLN
2.0000 g | Freq: Three times a day (TID) | INTRAVENOUS | Status: AC
  Administered 2018-05-02 (×2): 2 g via INTRAVENOUS
  Filled 2018-05-02 (×2): qty 100

## 2018-05-02 MED ORDER — FAMOTIDINE 20 MG PO TABS
20.0000 mg | ORAL_TABLET | Freq: Once | ORAL | Status: AC
  Administered 2018-05-02: 20 mg via ORAL

## 2018-05-02 MED ORDER — EVICEL 2 ML EX KIT
PACK | CUTANEOUS | Status: AC
  Filled 2018-05-02: qty 1

## 2018-05-02 MED ORDER — ONDANSETRON HCL 4 MG/2ML IJ SOLN
INTRAMUSCULAR | Status: DC | PRN
  Administered 2018-05-02: 4 mg via INTRAVENOUS

## 2018-05-02 MED ORDER — DEXAMETHASONE SODIUM PHOSPHATE 10 MG/ML IJ SOLN
INTRAMUSCULAR | Status: DC | PRN
  Administered 2018-05-02: 5 mg via INTRAVENOUS

## 2018-05-02 MED ORDER — DOCUSATE SODIUM 100 MG PO CAPS
100.0000 mg | ORAL_CAPSULE | Freq: Every day | ORAL | Status: DC
  Administered 2018-05-03: 100 mg via ORAL
  Filled 2018-05-02: qty 1

## 2018-05-02 MED ORDER — SUGAMMADEX SODIUM 200 MG/2ML IV SOLN
INTRAVENOUS | Status: DC | PRN
  Administered 2018-05-02: 100 mg via INTRAVENOUS

## 2018-05-02 MED ORDER — LIDOCAINE HCL (PF) 2 % IJ SOLN
INTRAMUSCULAR | Status: AC
  Filled 2018-05-02: qty 10

## 2018-05-02 MED ORDER — LATANOPROST 0.005 % OP SOLN
1.0000 [drp] | Freq: Every day | OPHTHALMIC | Status: DC
  Administered 2018-05-02: 1 [drp] via OPHTHALMIC
  Filled 2018-05-02: qty 2.5

## 2018-05-02 MED ORDER — EPHEDRINE SULFATE 50 MG/ML IJ SOLN
INTRAMUSCULAR | Status: AC
  Filled 2018-05-02: qty 1

## 2018-05-02 MED ORDER — GUAIFENESIN-DM 100-10 MG/5ML PO SYRP
15.0000 mL | ORAL_SOLUTION | ORAL | Status: DC | PRN
Start: 2018-05-02 — End: 2018-05-03
  Filled 2018-05-02: qty 15

## 2018-05-02 MED ORDER — OXYCODONE HCL 5 MG PO TABS: 5.0000 mg | ORAL_TABLET | ORAL | Status: DC | PRN

## 2018-05-02 MED ORDER — HEPARIN SODIUM (PORCINE) 5000 UNIT/ML IJ SOLN
INTRAMUSCULAR | Status: AC
  Filled 2018-05-02: qty 1

## 2018-05-02 MED ORDER — LABETALOL HCL 5 MG/ML IV SOLN: 10.0000 mg | INTRAVENOUS | Status: DC | PRN

## 2018-05-02 MED ORDER — KCL IN DEXTROSE-NACL 20-5-0.9 MEQ/L-%-% IV SOLN
INTRAVENOUS | Status: DC
  Administered 2018-05-02: 12:00:00 via INTRAVENOUS
  Filled 2018-05-02 (×4): qty 1000

## 2018-05-02 MED ORDER — PROPOFOL 10 MG/ML IV BOLUS
INTRAVENOUS | Status: AC
Start: 2018-05-02 — End: ?
  Filled 2018-05-02: qty 20

## 2018-05-02 MED ORDER — ONDANSETRON HCL 4 MG/2ML IJ SOLN
4.0000 mg | Freq: Four times a day (QID) | INTRAMUSCULAR | Status: DC | PRN
Start: 2018-05-02 — End: 2018-05-03

## 2018-05-02 MED ORDER — FENTANYL CITRATE (PF) 100 MCG/2ML IJ SOLN: 25.0000 ug | INTRAMUSCULAR | Status: DC | PRN

## 2018-05-02 MED ORDER — GLYCOPYRROLATE 0.2 MG/ML IJ SOLN
INTRAMUSCULAR | Status: AC
  Filled 2018-05-02: qty 1

## 2018-05-02 MED ORDER — ONDANSETRON HCL 4 MG/2ML IJ SOLN
INTRAMUSCULAR | Status: AC
  Filled 2018-05-02: qty 2

## 2018-05-02 MED ORDER — NIACIN 500 MG PO TABS
500.0000 mg | ORAL_TABLET | Freq: Every day | ORAL | Status: DC
Start: 2018-05-02 — End: 2018-05-03
  Administered 2018-05-02 – 2018-05-03 (×2): 500 mg via ORAL
  Filled 2018-05-02 (×2): qty 1

## 2018-05-02 MED ORDER — PHENOL 1.4 % MT LIQD
1.0000 | OROMUCOSAL | Status: DC | PRN
Start: 2018-05-02 — End: 2018-05-03

## 2018-05-02 MED ORDER — ROCURONIUM BROMIDE 100 MG/10ML IV SOLN
INTRAVENOUS | Status: DC | PRN
  Administered 2018-05-02: 5 mg via INTRAVENOUS
  Administered 2018-05-02: 45 mg via INTRAVENOUS

## 2018-05-02 MED ORDER — ALUM & MAG HYDROXIDE-SIMETH 200-200-20 MG/5ML PO SUSP
15.0000 mL | ORAL | Status: DC | PRN
  Filled 2018-05-02: qty 30

## 2018-05-02 MED ORDER — ONDANSETRON HCL 4 MG/2ML IJ SOLN: 4.0000 mg | Freq: Once | INTRAMUSCULAR | Status: DC | PRN

## 2018-05-02 MED ORDER — SEVOFLURANE IN SOLN
RESPIRATORY_TRACT | Status: AC
  Filled 2018-05-02: qty 250

## 2018-05-02 MED ORDER — PHENYLEPHRINE HCL 10 MG/ML IJ SOLN
INTRAMUSCULAR | Status: DC | PRN
  Administered 2018-05-02: 30 ug/min via INTRAVENOUS

## 2018-05-02 MED ORDER — LOSARTAN POTASSIUM 25 MG PO TABS
25.0000 mg | ORAL_TABLET | Freq: Every day | ORAL | Status: DC
  Administered 2018-05-02: 25 mg via ORAL
  Filled 2018-05-02: qty 1

## 2018-05-02 MED ORDER — PHENYLEPHRINE HCL 10 MG/ML IJ SOLN
INTRAMUSCULAR | Status: DC | PRN
  Administered 2018-05-02: 100 ug via INTRAVENOUS

## 2018-05-02 MED ORDER — BIOTIN 5000 MCG PO CAPS: 5000.0000 ug | ORAL_CAPSULE | Freq: Every day | ORAL | Status: DC

## 2018-05-02 MED ORDER — MORPHINE SULFATE (PF) 4 MG/ML IV SOLN: 2.0000 mg | INTRAVENOUS | Status: DC | PRN

## 2018-05-02 MED ORDER — LIDOCAINE HCL (CARDIAC) PF 100 MG/5ML IV SOSY
PREFILLED_SYRINGE | INTRAVENOUS | Status: DC | PRN
  Administered 2018-05-02: 60 mg via INTRAVENOUS

## 2018-05-02 MED ORDER — HEPARIN SODIUM (PORCINE) 1000 UNIT/ML IJ SOLN
INTRAMUSCULAR | Status: DC | PRN
  Administered 2018-05-02: 7000 [IU] via INTRAVENOUS

## 2018-05-02 MED ORDER — FENTANYL CITRATE (PF) 100 MCG/2ML IJ SOLN
INTRAMUSCULAR | Status: DC | PRN
  Administered 2018-05-02 (×2): 50 ug via INTRAVENOUS

## 2018-05-02 MED ORDER — ROCURONIUM BROMIDE 50 MG/5ML IV SOLN
INTRAVENOUS | Status: AC
  Filled 2018-05-02: qty 1

## 2018-05-02 MED ORDER — PHENYLEPHRINE HCL 10 MG/ML IJ SOLN
INTRAMUSCULAR | Status: AC
  Filled 2018-05-02: qty 1

## 2018-05-02 MED ORDER — FAMOTIDINE 20 MG PO TABS
ORAL_TABLET | ORAL | Status: AC
  Filled 2018-05-02: qty 1

## 2018-05-02 MED ORDER — LACTATED RINGERS IV SOLN
INTRAVENOUS | Status: DC
  Administered 2018-05-02: 07:00:00 via INTRAVENOUS

## 2018-05-02 MED ORDER — CEFAZOLIN SODIUM-DEXTROSE 2-4 GM/100ML-% IV SOLN
INTRAVENOUS | Status: AC
  Filled 2018-05-02: qty 100

## 2018-05-02 MED ORDER — LABETALOL HCL 5 MG/ML IV SOLN
INTRAVENOUS | Status: DC | PRN
  Administered 2018-05-02: 5 mg via INTRAVENOUS

## 2018-05-02 MED ORDER — AMLODIPINE BESYLATE 5 MG PO TABS
5.0000 mg | ORAL_TABLET | Freq: Every day | ORAL | Status: DC
  Administered 2018-05-03: 5 mg via ORAL
  Filled 2018-05-02: qty 1

## 2018-05-02 MED ORDER — METOPROLOL TARTRATE 5 MG/5ML IV SOLN: 2.0000 mg | INTRAVENOUS | Status: DC | PRN

## 2018-05-02 MED ORDER — DEXAMETHASONE SODIUM PHOSPHATE 10 MG/ML IJ SOLN
INTRAMUSCULAR | Status: AC
  Filled 2018-05-02: qty 1

## 2018-05-02 MED ORDER — ACETAMINOPHEN 325 MG RE SUPP
325.0000 mg | RECTAL | Status: DC | PRN
  Filled 2018-05-02: qty 2

## 2018-05-02 MED ORDER — ACETAMINOPHEN 325 MG PO TABS: 325.0000 mg | ORAL_TABLET | ORAL | Status: DC | PRN

## 2018-05-02 MED ORDER — FENTANYL CITRATE (PF) 100 MCG/2ML IJ SOLN
INTRAMUSCULAR | Status: AC
  Filled 2018-05-02: qty 2

## 2018-05-02 MED ORDER — ASPIRIN EC 81 MG PO TBEC
81.0000 mg | DELAYED_RELEASE_TABLET | Freq: Every day | ORAL | Status: DC
  Administered 2018-05-02 – 2018-05-03 (×2): 81 mg via ORAL
  Filled 2018-05-02 (×2): qty 1

## 2018-05-02 MED ORDER — PROPOFOL 10 MG/ML IV BOLUS
INTRAVENOUS | Status: DC | PRN
  Administered 2018-05-02: 100 mg via INTRAVENOUS

## 2018-05-02 MED ORDER — SODIUM CHLORIDE 0.9 % IV SOLN: 500.0000 mL | Freq: Once | INTRAVENOUS | Status: DC | PRN

## 2018-05-02 SURGICAL SUPPLY — 72 items
"PENCIL ELECTRO HAND CTR " (MISCELLANEOUS) IMPLANT
ADH SKN CLS APL DERMABOND .7 (GAUZE/BANDAGES/DRESSINGS) ×1
APPLIER CLIP 11 MED OPEN (CLIP)
APPLIER CLIP 9.375 SM OPEN (CLIP)
APR CLP MED 11 20 MLT OPN (CLIP)
APR CLP SM 9.3 20 MLT OPN (CLIP)
BAG DECANTER FOR FLEXI CONT (MISCELLANEOUS) ×2 IMPLANT
BLADE SURG 15 STRL LF DISP TIS (BLADE) ×1 IMPLANT
BLADE SURG 15 STRL SS (BLADE) ×2
BLADE SURG SZ11 CARB STEEL (BLADE) ×2 IMPLANT
BOOT SUTURE AID YELLOW STND (SUTURE) ×4 IMPLANT
BRUSH SCRUB EZ  4% CHG (MISCELLANEOUS) ×1
BRUSH SCRUB EZ 4% CHG (MISCELLANEOUS) ×1 IMPLANT
CANISTER SUCT 1200ML W/VALVE (MISCELLANEOUS) ×2 IMPLANT
CLIP APPLIE 11 MED OPEN (CLIP) IMPLANT
CLIP APPLIE 9.375 SM OPEN (CLIP) IMPLANT
DERMABOND ADVANCED (GAUZE/BANDAGES/DRESSINGS) ×1
DERMABOND ADVANCED .7 DNX12 (GAUZE/BANDAGES/DRESSINGS) ×1 IMPLANT
DRAPE INCISE IOBAN 66X45 STRL (DRAPES) ×2 IMPLANT
DRAPE LAPAROTOMY 100X77 ABD (DRAPES) ×2 IMPLANT
DRAPE SHEET LG 3/4 BI-LAMINATE (DRAPES) ×2 IMPLANT
DRESSING SURGICEL FIBRLLR 1X2 (HEMOSTASIS) ×1 IMPLANT
DRSG SURGICEL FIBRILLAR 1X2 (HEMOSTASIS)
DURAPREP 26ML APPLICATOR (WOUND CARE) ×2 IMPLANT
ELECT CAUTERY BLADE 6.4 (BLADE) ×2 IMPLANT
ELECT REM PT RETURN 9FT ADLT (ELECTROSURGICAL) ×2
ELECTRODE REM PT RTRN 9FT ADLT (ELECTROSURGICAL) ×1 IMPLANT
GLOVE BIO SURGEON STRL SZ7 (GLOVE) ×2 IMPLANT
GLOVE INDICATOR 7.5 STRL GRN (GLOVE) ×2 IMPLANT
GLOVE SURG SYN 8.0 (GLOVE) ×2 IMPLANT
GLOVE SURG SYN 8.0 PF PI (GLOVE) ×1 IMPLANT
GOWN STRL REUS W/ TWL LRG LVL3 (GOWN DISPOSABLE) ×2 IMPLANT
GOWN STRL REUS W/ TWL XL LVL3 (GOWN DISPOSABLE) ×1 IMPLANT
GOWN STRL REUS W/TWL LRG LVL3 (GOWN DISPOSABLE) ×4
GOWN STRL REUS W/TWL XL LVL3 (GOWN DISPOSABLE) ×2
HOLDER FOLEY CATH W/STRAP (MISCELLANEOUS) ×2 IMPLANT
IV NS 500ML (IV SOLUTION) ×2
IV NS 500ML BAXH (IV SOLUTION) ×1 IMPLANT
KIT TURNOVER KIT A (KITS) ×2 IMPLANT
LABEL OR SOLS (LABEL) ×2 IMPLANT
LOOP RED MAXI  1X406MM (MISCELLANEOUS) ×2
LOOP VESSEL MAXI 1X406 RED (MISCELLANEOUS) ×2 IMPLANT
LOOP VESSEL MINI 0.8X406 BLUE (MISCELLANEOUS) ×1 IMPLANT
LOOPS BLUE MINI 0.8X406MM (MISCELLANEOUS) ×2
NDL FILTER BLUNT 18X1 1/2 (NEEDLE) ×1 IMPLANT
NDL HYPO 25X1 1.5 SAFETY (NEEDLE) ×1 IMPLANT
NEEDLE FILTER BLUNT 18X 1/2SAF (NEEDLE) ×1
NEEDLE FILTER BLUNT 18X1 1/2 (NEEDLE) ×1 IMPLANT
NEEDLE HYPO 25X1 1.5 SAFETY (NEEDLE) ×2 IMPLANT
NS IRRIG 500ML POUR BTL (IV SOLUTION) ×2 IMPLANT
PACK BASIN MAJOR ARMC (MISCELLANEOUS) ×2 IMPLANT
PATCH CAROTID ECM VASC 1X10 (Prosthesis & Implant Heart) ×1 IMPLANT
PENCIL ELECTRO HAND CTR (MISCELLANEOUS) IMPLANT
SHUNT CAROTID STR REINF 3.0X4. (MISCELLANEOUS) ×2 IMPLANT
SUT MNCRL+ 5-0 UNDYED PC-3 (SUTURE) ×1 IMPLANT
SUT MONOCRYL 5-0 (SUTURE) ×1
SUT PROLENE 6 0 BV (SUTURE) ×14 IMPLANT
SUT PROLENE 7 0 BV 1 (SUTURE) ×9 IMPLANT
SUT PROLENE BV 1 BLUE 7-0 30IN (SUTURE) ×3 IMPLANT
SUT SILK 2 0 (SUTURE) ×2
SUT SILK 2-0 18XBRD TIE 12 (SUTURE) ×1 IMPLANT
SUT SILK 3 0 (SUTURE) ×2
SUT SILK 3-0 18XBRD TIE 12 (SUTURE) ×1 IMPLANT
SUT SILK 4 0 (SUTURE) ×2
SUT SILK 4-0 18XBRD TIE 12 (SUTURE) ×1 IMPLANT
SUT VIC AB 3-0 SH 27 (SUTURE) ×2
SUT VIC AB 3-0 SH 27X BRD (SUTURE) ×1 IMPLANT
SYR 10ML LL (SYRINGE) ×2 IMPLANT
SYR 20CC LL (SYRINGE) ×2 IMPLANT
SYR 3ML LL SCALE MARK (SYRINGE) ×2 IMPLANT
TRAY FOLEY MTR SLVR 16FR STAT (SET/KITS/TRAYS/PACK) ×2 IMPLANT
TUBING CONNECTING 10 (TUBING) IMPLANT

## 2018-05-02 NOTE — Anesthesia Procedure Notes (Signed)
Procedure Name: Intubation Date/Time: 05/02/2018 7:42 AM Performed by: Christain Sacramento, RN Pre-anesthesia Checklist: Patient identified, Emergency Drugs available, Suction available, Timeout performed and Patient being monitored Patient Re-evaluated:Patient Re-evaluated prior to induction Oxygen Delivery Method: Circle system utilized Preoxygenation: Pre-oxygenation with 100% oxygen Induction Type: IV induction Ventilation: Mask ventilation without difficulty Laryngoscope Size: Mac and 3 Grade View: Grade I Tube type: Oral Tube size: 7.0 mm Number of attempts: 1 Airway Equipment and Method: Stylet Placement Confirmation: ETT inserted through vocal cords under direct vision,  positive ETCO2 and breath sounds checked- equal and bilateral Secured at: 21 cm Tube secured with: Tape Dental Injury: Teeth and Oropharynx as per pre-operative assessment

## 2018-05-02 NOTE — Progress Notes (Signed)
PHARMACIST - PHYSICIAN ORDER COMMUNICATION  CONCERNING: P&T Medication Policy on Herbal Medications  DESCRIPTION:  This patient's order for:  Biotin  has been noted.  This product(s) is classified as an "herbal" or natural product. Due to a lack of definitive safety studies or FDA approval, nonstandard manufacturing practices, plus the potential risk of unknown drug-drug interactions while on inpatient medications, the Pharmacy and Therapeutics Committee does not permit the use of "herbal" or natural products of this type within [place].   ACTION TAKEN: The pharmacy department is unable to verify this order at this time Please reevaluate patient's clinical condition at discharge and address if the herbal or natural product(s) should be resumed at that time.    

## 2018-05-02 NOTE — Transfer of Care (Signed)
Immediate Anesthesia Transfer of Care Note  Patient: Cindy FeinsteinMildred M Mcdonald  Procedure(s) Performed: ENDARTERECTOMY CAROTID (Left )  Patient Location: PACU  Anesthesia Type:General  Level of Consciousness: awake, alert  and patient cooperative  Airway & Oxygen Therapy: Patient Spontanous Breathing and Patient connected to face mask oxygen  Post-op Assessment: Report given to RN and Post -op Vital signs reviewed and stable  Post vital signs: Reviewed and stable  Last Vitals:  Vitals Value Taken Time  BP 160/82 05/02/2018 10:16 AM  Temp 36.1 C 05/02/2018 10:16 AM  Pulse 74 05/02/2018 10:16 AM  Resp 18 05/02/2018 10:16 AM  SpO2 100 % 05/02/2018 10:16 AM  Vitals shown include unvalidated device data.  Last Pain:  Vitals:   05/02/18 0617  TempSrc: Temporal         Complications: No apparent anesthesia complications

## 2018-05-02 NOTE — Progress Notes (Signed)
While rounding in the ICU, chaplain walked past the patient's room and was greeted with a smile. Chaplain decided to speak with the patient who was recovering from a surgery to address carotid artery stenosis. She was alert, welcoming and oriented. Chaplain engaged in active listening as the patient shared stories about her grandchildren and their spouses/significant others, many of whom are in the medical field. While engaging in conversation, patient's daughter and son-in-law returned to the room. They noted that she was "much more alert" and coherent "this time". Patient and family thanked the chaplain for coming.    05/02/18 1700  Clinical Encounter Type  Visited With Patient;Patient and family together  Visit Type Initial

## 2018-05-02 NOTE — Anesthesia Post-op Follow-up Note (Signed)
Anesthesia QCDR form completed.        

## 2018-05-02 NOTE — Anesthesia Preprocedure Evaluation (Signed)
Anesthesia Evaluation  Patient identified by MRN, date of birth, ID band Patient awake    Reviewed: Allergy & Precautions, NPO status , Patient's Chart, lab work & pertinent test results  History of Anesthesia Complications (+) Emergence Delirium  Airway Mallampati: III       Dental   Pulmonary neg sleep apnea, neg COPD,           Cardiovascular hypertension, Pt. on medications + CAD and + Peripheral Vascular Disease  (-) Past MI and (-) CHF (-) dysrhythmias (-) Valvular Problems/Murmurs     Neuro/Psych neg Seizures    GI/Hepatic Neg liver ROS, neg GERD  ,  Endo/Other  neg diabetes  Renal/GU negative Renal ROS     Musculoskeletal   Abdominal   Peds  Hematology   Anesthesia Other Findings   Reproductive/Obstetrics                             Anesthesia Physical Anesthesia Plan  ASA: III  Anesthesia Plan: General   Post-op Pain Management:    Induction: Intravenous  PONV Risk Score and Plan: 3 and Dexamethasone, Ondansetron and Treatment may vary due to age or medical condition  Airway Management Planned: Oral ETT  Additional Equipment:   Intra-op Plan:   Post-operative Plan:   Informed Consent: I have reviewed the patients History and Physical, chart, labs and discussed the procedure including the risks, benefits and alternatives for the proposed anesthesia with the patient or authorized representative who has indicated his/her understanding and acceptance.     Plan Discussed with:   Anesthesia Plan Comments:         Anesthesia Quick Evaluation

## 2018-05-02 NOTE — Anesthesia Postprocedure Evaluation (Signed)
Anesthesia Post Note  Patient: Cindy Mcdonald  Procedure(s) Performed: ENDARTERECTOMY CAROTID (Left )  Patient location during evaluation: PACU Anesthesia Type: General Level of consciousness: awake and alert Pain management: pain level controlled Vital Signs Assessment: post-procedure vital signs reviewed and stable Respiratory status: spontaneous breathing and respiratory function stable Cardiovascular status: stable Anesthetic complications: no     Last Vitals:  Vitals:   05/02/18 0617 05/02/18 1016  BP: (!) 150/77 (!) 160/82  Pulse: 73 75  Resp: 18 18  Temp: (!) 36.2 C (!) 36.1 C  SpO2: 100% 100%    Last Pain:  Vitals:   05/02/18 1016  TempSrc:   PainSc: 0-No pain                 Arrin Pintor K

## 2018-05-02 NOTE — Op Note (Signed)
[facility]   OPERATIVE NOTE  PROCEDURE:   1.  Left carotid endarterectomy with CorMatrix arterial patch reconstruction  PRE-OPERATIVE DIAGNOSIS: 1.  Critical carotid stenosis 2.  Hypercholesterolemia  POST-OPERATIVE DIAGNOSIS: same as above   SURGEON: Renford Dills, MD  ASSISTANT(S): None  ANESTHESIA: general  ESTIMATED BLOOD LOSS: 75 cc  FINDING(S): 1.  Extensive calcified carotid plaque.  SPECIMEN(S):  Carotid plaque (sent to Pathology)  INDICATIONS:   Cindy Mcdonald is a 84 y.o. y.o. female who presents with left internal carotid stenosis of 90 %.  The risks, benefits, and alternatives to carotid endarterectomy were discussed with the patient. The differences between carotid stenting and carotid endarterectomy were reviewed.  The patient voiced understanding and appears to be aware that the risks of carotid endarterectomy include but are not limited to: bleeding, infection, stroke, myocardial infarction, death, cranial nerve injuries both temporary and permanent, neck hematoma, possible airway compromise, labile blood pressure post-operatively, cerebral hyperperfusion syndrome, and possible need for additional interventions in the future. The patient is aware of the risks and agrees to proceed forward with the procedure.  DESCRIPTION: After full informed written consent was obtained from the patient, the patient was brought back to the operating room and placed supine upon the operating table.  Prior to induction, the patient received IV antibiotics.  After obtaining adequate anesthesia, the patient was placed a supine position with a shoulder roll in place and the patient's neck slightly hyperextended and rotated away from the surgical site.  The patient was prepped in the standard fashion for a carotid endarterectomy.    The incision was made anterior to the sternocleidomastoid muscle and dissected down through the subcutaneous tissue.  The platysmas  was opened with electrocautery.  The internal jugular vein and facial vein were identified.  The facial vein is ligated and divided between 2-0 silk ties.  The omohyoid was identified in the common carotid artery exposed at this level. The dissection was there in carried out along the carotid artery in a cranial direction.  The dissection was then carried along periadventitial plane along the common carotid artery up to the bifurcation. The external carotid artery was identified. Vessel loops were then placed around the external carotid artery as well as the superior thyroid artery. In the process of this dissection, the hypoglossal nerve was identified and protected from harm.  The internal carotid artery was then dissected circumferentially just beyond an area in the internal carotid artery distal to the plaque.    At this point, we gave the patient 7000 units of intravenous heparin.  After this was allowed to circulate for several minutes, the common carotid followed by the external carotid and then the internal carotid artery were clamped.  Arteriotomy was made in the common carotid artery with a 11 blade, and extended the arteriotomy with a Potts scissor down into the common carotid artery, then the arteriotomy was carried through the bifurcation into the internal carotid artery until I reached an area that was not diseased.  At this point, a Sundt shunt was placed.  The endarterectomy was begun in the common carotid artery with a Runner, broadcasting/film/video and carried this dissection down into the common carotid artery circumferentially.  Then I transected the plaque at a segment where it was adherent and transected the plaque with Potts scissors.  I then carried this dissection up into the external carotid artery.  The plaque was extracted by unclamping the external carotid artery and performing an  eversion endarterectomy.  The dissection was then carried into the internal carotid artery where a  feathered end  point was created.  The plaque was passed off the field as a specimen.  The distal endpoint was tacked down with 6 interrupted 7-0 Prolene sutures.  A CorMatrix arterial patch was delivered onto the field and trimmed appropriately for the artery and sewed in place with 6-0 Prolene using a 4 quadrant technique.  The medial suture line was completed and the lateral suture line was run approximately one quarter the length of the arteriotomy.  Prior to completing this patch angioplasty, the shunt was removed, the internal carotid artery was flushed and there was excellent backbleeding.  The carotid artery repair was flushed with heparinized saline and then the patch angioplasty was completed in the usual fashion.  The flow was then reestablished first to the external carotid artery and then the internal carotid artery to prevent distal embolization.   Several minutes of pressure were held and 6-0 Prolene patch sutures were used as need for hemostasis.  At this point, I placed Surgicel and Evicel topical hemostatic agents.  There was no more active bleeding in the surgical site.  The sternocleidomastoid space was closed with three interrupted 3-0 Vicryl sutures. I then reapproximated the platysma muscle with a running stitch of 3-0 Vicryl.  The skin was then closed with a running subcuticular 4-0 Monocryl.  The skin was then cleaned, dried and Dermabond was used to reinforce the skin closure.  The patient awakened and was taken to the recovery room in stable condition, following commands and moving all four extremities without any apparent deficits.    COMPLICATIONS: none  CONDITION: stable  Cindy Mcdonald 05/02/2018<10:15 AM

## 2018-05-02 NOTE — H&P (Signed)
[facility] History & Physical Update  The patient was interviewed and re-examined.  The patient's previous History and Physical has been reviewed and is unchanged.  There is no change in the plan of care. We plan to proceed with the scheduled procedure.  Levora DredgeGregory Makaylie Dedeaux, MD  05/02/2018, 7:21 AM

## 2018-05-03 LAB — MAGNESIUM: Magnesium: 2.1 mg/dL (ref 1.7–2.4)

## 2018-05-03 LAB — BASIC METABOLIC PANEL
Anion gap: 6 (ref 5–15)
BUN: 23 mg/dL (ref 8–23)
CO2: 22 mmol/L (ref 22–32)
Calcium: 8.5 mg/dL — ABNORMAL LOW (ref 8.9–10.3)
Chloride: 112 mmol/L — ABNORMAL HIGH (ref 98–111)
Creatinine, Ser: 0.95 mg/dL (ref 0.44–1.00)
GFR calc Af Amer: >60 mL/min (ref >60)
GFR calc non Af Amer: 53 mL/min — ABNORMAL LOW (ref >60)
Glucose, Bld: 137 mg/dL — ABNORMAL HIGH (ref 70–99)
Potassium: 4.6 mmol/L (ref 3.5–5.1)
Sodium: 140 mmol/L (ref 135–145)

## 2018-05-03 LAB — CBC
HCT: 32.1 % — ABNORMAL LOW (ref 35.0–47.0)
Hemoglobin: 10.6 g/dL — ABNORMAL LOW (ref 12.0–16.0)
MCH: 28.7 pg (ref 26.0–34.0)
MCHC: 33 g/dL (ref 32.0–36.0)
MCV: 86.8 fL (ref 80.0–100.0)
Platelets: 212 10*3/uL (ref 150–440)
RBC: 3.7 MIL/uL — ABNORMAL LOW (ref 3.80–5.20)
RDW: 13.5 % (ref 11.5–14.5)
WBC: 15.6 10*3/uL — ABNORMAL HIGH (ref 3.6–11.0)

## 2018-05-03 LAB — SURGICAL PATHOLOGY

## 2018-05-03 NOTE — Plan of Care (Signed)
Pt discharged to home, family at bedside for transportation, [facility], PIVs DCd, bleeding controlled, no s/sx bleeding or headache, endartarectomy site clean and dry, visible ecchymosis. VSS on room air.  No new scrips, instructions given, understanding verbalized

## 2018-05-03 NOTE — Discharge Summary (Signed)
[facility]    [facility]    Patient ID:  Cindy SonsMildred M Mcdonald MRN: [id number] DOB/AGE: 82/04/1930 82 y.o.  Admit date: 05/02/2018 Discharge date: 05/03/2018 Date of Surgery: 05/02/2018 Surgeon: Surgeon(s): Hawley Michel, Latina CraverGregory G, MD  Admission Diagnosis: CAROTID ARTERY STENOSIS  Discharge Diagnoses:  CAROTID ARTERY STENOSIS  Secondary Diagnoses: Past Medical History:  Diagnosis Date  . Arthritis   . Carotid artery occlusion   . Carotid artery occlusion   . Complication of anesthesia    difficulty waking up for 3 days after back surgery  . Hyperlipidemia   . Hypertension     Procedure(s): ENDARTERECTOMY CAROTID  Discharged Condition: good  HPI:  The patient presented yesterday for elective left carotid endarterectomy.  Surgery was performed without complication.  Postoperatively she has had a normal course with normal blood pressure readings.  She has not required any parenteral medications.  This morning she is tolerating a regular diet.  Hospital Course:  Cindy SonsMildred M Mcdonald is a 84 y.o. female is S/P Left Procedure(s): ENDARTERECTOMY CAROTID Extubated: POD # 0 Physical exam: Neurologically intact Post-op wounds clean, dry, intact or healing well Pt. Ambulating, voiding and taking PO diet without difficulty. Pt pain controlled with PO pain meds. Labs as below Complications:none  Consults:  Treatment Team:  Tora Kindredonforti, John, DO  Significant Diagnostic Studies: CBC Lab Results  Component Value Date   WBC 15.6 (H) 05/03/2018   HGB 10.6 (L) 05/03/2018   HCT 32.1 (L) 05/03/2018   MCV 86.8 05/03/2018   PLT 212 05/03/2018    BMET    Component Value Date/Time   NA 140 05/03/2018 0350   K 4.6 05/03/2018 0350   CL 112 (H) 05/03/2018 0350   CO2 22 05/03/2018 0350   GLUCOSE 137 (H) 05/03/2018 0350   BUN 23 05/03/2018 0350   CREATININE 0.95 05/03/2018 0350   CALCIUM 8.5 (L) 05/03/2018 0350   GFRNONAA 53 (L) 05/03/2018 0350   GFRAA  >60 05/03/2018 0350   COAG Lab Results  Component Value Date   INR 1.05 04/30/2018   INR 1.01 03/08/2010     Disposition:  Discharge to :Home Discharge Instructions    Call MD for:  redness, tenderness, or signs of infection (pain, swelling, bleeding, redness, odor or green/yellow discharge around incision site)   Complete by:  As directed    Call MD for:  severe or increased pain, loss or decreased feeling  in affected limb(s)   Complete by:  As directed    Call MD for:  temperature >100.5   Complete by:  As directed    Discharge instructions   Complete by:  As directed    OK to shower after 36 hours   Driving Restrictions   Complete by:  As directed    No driving for one week   No dressing needed   Complete by:  As directed    Replace only if drainage present   Resume previous diet   Complete by:  As directed      Allergies as of 05/03/2018      Reactions   Codeine    Unknown   Nsaids    Dizziness, tears up stomach   Statins    Leg pain       Medication List    TAKE these medications   acetaminophen 500 MG tablet Commonly known as:  TYLENOL Take 500 mg by mouth 2 (two) times daily as needed for moderate pain or headache.   amLODipine 5  MG tablet Commonly known as:  NORVASC Take 5 mg by mouth once daily   aspirin EC 81 MG tablet Take 81 mg by mouth daily.   Biotin 5000 MCG Caps Take 5,000 mcg by mouth daily.   dorzolamide-timolol 22.3-6.8 MG/ML ophthalmic solution Commonly known as:  COSOPT INSTILL 1 DROP TO BOTH EYES 2 TIMES A DAY   Fish Oil 1000 MG Cpdr Take 1,000-2,000 mg by mouth See admin instructions. Take 2000 mg in the morning and 1000 mg at night   GARLIC PO Take 1 tablet by mouth daily.   latanoprost 0.005 % ophthalmic solution Commonly known as:  XALATAN Place 1 drop into both eyes at bedtime.   losartan 25 MG tablet Commonly known as:  COZAAR Take 25 mg by mouth at bedtime.   niacin 500 MG tablet Take 500 mg by mouth daily.    VIACTIV CALCIUM PLUS D PO Take 1-2 Doses by mouth 2 (two) times daily. 2 viactiv in the morning and 1 at night      Verbal and written Discharge instructions given to the patient. Wound care per Discharge AVS Follow-up Information    Sultan Pargas, Latina Craver, MD Follow up in 2 week(s).   Specialties:  Vascular Surgery, Cardiology, Radiology, Vascular Surgery Why:  no studies  Contact information: [address] [place] [address] [phone]           Signed: Levora Dredge, MD  05/03/2018, 7:50 AM

## 2018-05-15 ENCOUNTER — Encounter: Payer: Self-pay | Admitting: Vascular Surgery

## 2018-05-21 ENCOUNTER — Encounter (INDEPENDENT_AMBULATORY_CARE_PROVIDER_SITE_OTHER): Payer: Self-pay | Admitting: Vascular Surgery

## 2018-05-21 ENCOUNTER — Ambulatory Visit (INDEPENDENT_AMBULATORY_CARE_PROVIDER_SITE_OTHER): Payer: PPO | Admitting: Vascular Surgery

## 2018-05-21 VITALS — BP 162/80 | HR 74 | Resp 13 | Ht 64.0 in | Wt 132.0 lb

## 2018-05-21 DIAGNOSIS — I6522 Occlusion and stenosis of left carotid artery: Secondary | ICD-10-CM

## 2018-05-21 NOTE — Progress Notes (Signed)
Patient ID: Cindy Mcdonald Okelley, female   DOB: 12/20/1929, 84 y.o.   MRN: [id number]  Chief Complaint  Patient presents with  . Follow-up    2 week [facility] follow up    HPI Cindy Mcdonald Arntz is a 84 y.o. female.  The patient is seen for follow up evaluation of carotid stenosis status post left carotid endarterectomy on 05/02/2018.  There were no post operative problems or complications related to the surgery.  The patient denies neck or incisional pain.  The patient denies interval amaurosis fugax. There is no recent history of TIA symptoms or focal motor deficits. There is no prior documented CVA.  The patient denies headache.  The patient is taking enteric-coated aspirin 81 mg daily.  The patient has a history of coronary artery disease, no recent episodes of angina or shortness of breath. The patient denies PAD or claudication symptoms. There is a history of hyperlipidemia which is being treated with a statin.     Past Medical History:  Diagnosis Date  . Arthritis   . Carotid artery occlusion   . Carotid artery occlusion   . Complication of anesthesia    difficulty waking up for 3 days after back surgery  . Hyperlipidemia   . Hypertension     Past Surgical History:  Procedure Laterality Date  . BACK SURGERY    . CAROTID STENT    . CATARACT EXTRACTION, BILATERAL Bilateral   . ENDARTERECTOMY Left 05/02/2018   Procedure: ENDARTERECTOMY CAROTID;  Surgeon: Renford DillsSchnier, Tatem Holsonback G, MD;  Location: [facility] ORS;  Service: Vascular;  Laterality: Left;  . JOINT REPLACEMENT    . REPLACEMENT TOTAL HIP W/  RESURFACING IMPLANTS     times two  . TONSILLECTOMY    . TOTAL SHOULDER REPLACEMENT Right       Allergies  Allergen Reactions  . Codeine     Unknown  . Nsaids     Dizziness, tears up stomach  . Statins     Leg pain     Current Outpatient Medications  Medication Sig Dispense Refill  . acetaminophen (TYLENOL) 500 MG tablet Take 500 mg by mouth 2 (two) times daily as needed for  moderate pain or headache.    Marland Kitchen. amLODipine (NORVASC) 5 MG tablet Take 5 mg by mouth once daily    . aspirin EC 81 MG tablet Take 81 mg by mouth daily.     . Biotin 5000 MCG CAPS Take 5,000 mcg by mouth daily.    . Calcium-Vitamin D-Vitamin K (VIACTIV CALCIUM PLUS D PO) Take 1-2 Doses by mouth 2 (two) times daily. 2 viactiv in the morning and 1 at night    . dorzolamide-timolol (COSOPT) 22.3-6.8 MG/ML ophthalmic solution INSTILL 1 DROP TO BOTH EYES 2 TIMES A DAY    . GARLIC PO Take 1 tablet by mouth daily.    Marland Kitchen. latanoprost (XALATAN) 0.005 % ophthalmic solution Place 1 drop into both eyes at bedtime.     Marland Kitchen. losartan (COZAAR) 25 MG tablet Take 25 mg by mouth at bedtime.     . niacin 500 MG tablet Take 500 mg by mouth daily.     . Omega-3 Fatty Acids (FISH OIL) 1000 MG CPDR Take 1,000-2,000 mg by mouth See admin instructions. Take 2000 mg in the morning and 1000 mg at night     No current facility-administered medications for this visit.         Physical Exam BP (!) 162/80 (BP Location: Right Arm, Patient Position: Sitting)  Pulse 74   Resp 13   Ht 5\' 4"  (1.626 Mcdonald)   Wt 132 lb (59.9 kg)   BMI 22.66 kg/Mcdonald  Gen:  WD/WN, NAD Skin: incision C/D/I Neuro: intact    Assessment/Plan: 1. Carotid stenosis, asymptomatic, left Recommend:  The patient is s/p successful left CEA  Continue antiplatelet therapy as prescribed Continue management of CAD, HTN and Hyperlipidemia Healthy heart diet,  encouraged exercise at least 4 times per week  Follow up in 6 months with duplex ultrasound and physical exam   - VAS US CAROTID; Future - VAS US CAROTID; Future      Levora DredgeGregory Karmella Bouvier 05/21/2018, 1:59 PM   This note was created with Dragon medical transcription system.  Any errors from dictation are unintentional.

## 2018-06-06 DIAGNOSIS — I10 Essential (primary) hypertension: Secondary | ICD-10-CM | POA: Diagnosis not present

## 2018-06-06 DIAGNOSIS — E785 Hyperlipidemia, unspecified: Secondary | ICD-10-CM | POA: Diagnosis not present

## 2018-06-08 DIAGNOSIS — I10 Essential (primary) hypertension: Secondary | ICD-10-CM | POA: Diagnosis not present

## 2018-06-08 DIAGNOSIS — E785 Hyperlipidemia, unspecified: Secondary | ICD-10-CM | POA: Diagnosis not present

## 2018-06-13 DIAGNOSIS — I2510 Atherosclerotic heart disease of native coronary artery without angina pectoris: Secondary | ICD-10-CM | POA: Diagnosis not present

## 2018-06-13 DIAGNOSIS — E782 Mixed hyperlipidemia: Secondary | ICD-10-CM | POA: Diagnosis not present

## 2018-06-13 DIAGNOSIS — I6523 Occlusion and stenosis of bilateral carotid arteries: Secondary | ICD-10-CM | POA: Diagnosis not present

## 2018-06-13 DIAGNOSIS — I10 Essential (primary) hypertension: Secondary | ICD-10-CM | POA: Diagnosis not present

## 2018-06-13 DIAGNOSIS — R42 Dizziness and giddiness: Secondary | ICD-10-CM | POA: Diagnosis not present

## 2018-07-19 DIAGNOSIS — Z23 Encounter for immunization: Secondary | ICD-10-CM | POA: Diagnosis not present

## 2018-08-21 DIAGNOSIS — H401132 Primary open-angle glaucoma, bilateral, moderate stage: Secondary | ICD-10-CM | POA: Diagnosis not present

## 2018-11-26 ENCOUNTER — Encounter (INDEPENDENT_AMBULATORY_CARE_PROVIDER_SITE_OTHER): Payer: Self-pay | Admitting: Vascular Surgery

## 2018-11-26 ENCOUNTER — Ambulatory Visit (INDEPENDENT_AMBULATORY_CARE_PROVIDER_SITE_OTHER): Payer: PPO

## 2018-11-26 ENCOUNTER — Ambulatory Visit (INDEPENDENT_AMBULATORY_CARE_PROVIDER_SITE_OTHER): Payer: PPO | Admitting: Vascular Surgery

## 2018-11-26 ENCOUNTER — Other Ambulatory Visit: Payer: Self-pay

## 2018-11-26 VITALS — BP 146/77 | HR 72 | Resp 10 | Ht 64.0 in | Wt 132.0 lb

## 2018-11-26 DIAGNOSIS — I25118 Atherosclerotic heart disease of native coronary artery with other forms of angina pectoris: Secondary | ICD-10-CM | POA: Diagnosis not present

## 2018-11-26 DIAGNOSIS — I6522 Occlusion and stenosis of left carotid artery: Secondary | ICD-10-CM

## 2018-11-26 DIAGNOSIS — I10 Essential (primary) hypertension: Secondary | ICD-10-CM | POA: Diagnosis not present

## 2018-11-26 DIAGNOSIS — Z7982 Long term (current) use of aspirin: Secondary | ICD-10-CM | POA: Diagnosis not present

## 2018-11-26 DIAGNOSIS — E782 Mixed hyperlipidemia: Secondary | ICD-10-CM | POA: Diagnosis not present

## 2018-11-26 DIAGNOSIS — I6523 Occlusion and stenosis of bilateral carotid arteries: Secondary | ICD-10-CM | POA: Diagnosis not present

## 2018-11-26 NOTE — Progress Notes (Signed)
MRN : [id number]  Cindy Mcdonald is a 85 y.o. (09-16-1930) female who presents with chief complaint of No chief complaint on file. Marland Kitchen  History of Present Illness:   The patient is seen for follow up evaluation of carotid stenosis status post left carotid endarterectomy on 05/02/2018.  There were no post operative problems or complications related to the surgery.  The patient denies neck or incisional pain.  The patient denies interval amaurosis fugax. There is no recent history of TIA symptoms or focal motor deficits. There is no prior documented CVA.  The patient denies headache.  The patient is taking enteric-coated aspirin 81 mg daily.  The patient has a history of coronary artery disease, no recent episodes of angina or shortness of breath. The patient denies PAD or claudication symptoms. There is a history of hyperlipidemia which is being treated with a statin.   Du[plex ultrasound of the carotid arteries today shows <30% bilateral stenosis  No outpatient medications have been marked as taking for the 11/26/18 encounter (Appointment) with Gilda Crease, Latina Craver, MD.    Past Medical History:  Diagnosis Date  . Arthritis   . Carotid artery occlusion   . Carotid artery occlusion   . Complication of anesthesia    difficulty waking up for 3 days after back surgery  . Hyperlipidemia   . Hypertension     Past Surgical History:  Procedure Laterality Date  . BACK SURGERY    . CAROTID STENT    . CATARACT EXTRACTION, BILATERAL Bilateral   . ENDARTERECTOMY Left 05/02/2018   Procedure: ENDARTERECTOMY CAROTID;  Surgeon: Renford Dills, MD;  Location: [facility] ORS;  Service: Vascular;  Laterality: Left;  . JOINT REPLACEMENT    . REPLACEMENT TOTAL HIP W/  RESURFACING IMPLANTS     times two  . TONSILLECTOMY    . TOTAL SHOULDER REPLACEMENT Right     Social History Social History   Tobacco Use  . Smoking status: Never Smoker  . Smokeless tobacco: Never Used  Substance Use  Topics  . Alcohol use: Not Currently  . Drug use: Never    Family History Family History  Problem Relation Age of Onset  . Hypertension Mother   . Heart disease Father     Allergies  Allergen Reactions  . Codeine     Unknown  . Nsaids     Dizziness, tears up stomach  . Statins     Leg pain      REVIEW OF SYSTEMS (Negative unless checked)  Constitutional: [] Weight loss  [] Fever  [] Chills Cardiac: [] Chest pain   [] Chest pressure   [] Palpitations   [] Shortness of breath when laying flat   [] Shortness of breath with exertion. Vascular:  [] Pain in legs with walking   [] Pain in legs at rest  [] History of DVT   [] Phlebitis   [] Swelling in legs   [] Varicose veins   [] Non-healing ulcers Pulmonary:   [] Uses home oxygen   [] Productive cough   [] Hemoptysis   [] Wheeze  [] COPD   [] Asthma Neurologic:  [] Dizziness   [] Seizures   [] History of stroke   [] History of TIA  [] Aphasia   [] Vissual changes   [] Weakness or numbness in arm   [] Weakness or numbness in leg Musculoskeletal:   [] Joint swelling   [] Joint pain   [] Low back pain Hematologic:  [] Easy bruising  [] Easy bleeding   [] Hypercoagulable state   [] Anemic Gastrointestinal:  [] Diarrhea   [] Vomiting  [] Gastroesophageal reflux/heartburn   [] Difficulty swallowing. Genitourinary:  [] Chronic kidney  disease   [] Difficult urination  [] Frequent urination   [] Blood in urine Skin:  [] Rashes   [] Ulcers  Psychological:  [] History of anxiety   []  History of major depression.  Physical Examination  There were no vitals filed for this visit. There is no height or weight on file to calculate BMI. Gen: WD/WN, NAD Head: [place]/AT, No temporalis wasting.  Ear/Nose/Throat: Hearing grossly intact, nares w/o erythema or drainage Eyes: PER, EOMI, sclera nonicteric.  Neck: Supple, no large masses.   Pulmonary:  Good air movement, no audible wheezing bilaterally, no use of accessory muscles.  Cardiac: RRR, no JVD Vascular: no carotid bruits  Incision well  healed Vessel Right Left  Radial Palpable Palpable  Brachial Palpable Palpable  Carotid Palpable Palpable  Gastrointestinal: Non-distended. No guarding/no peritoneal signs.  Musculoskeletal: M/S 5/5 throughout.  No deformity or atrophy.  Neurologic: CN 2-12 intact. Symmetrical.  Speech is fluent. Motor exam as listed above. Psychiatric: Judgment intact, Mood & affect appropriate for pt's clinical situation. Dermatologic: No rashes or ulcers noted.  No changes consistent with cellulitis. Lymph : No lichenification or skin changes of chronic lymphedema.  CBC Lab Results  Component Value Date   WBC 15.6 (H) 05/03/2018   HGB 10.6 (L) 05/03/2018   HCT 32.1 (L) 05/03/2018   MCV 86.8 05/03/2018   PLT 212 05/03/2018    BMET    Component Value Date/Time   NA 140 05/03/2018 0350   K 4.6 05/03/2018 0350   CL 112 (H) 05/03/2018 0350   CO2 22 05/03/2018 0350   GLUCOSE 137 (H) 05/03/2018 0350   BUN 23 05/03/2018 0350   CREATININE 0.95 05/03/2018 0350   CALCIUM 8.5 (L) 05/03/2018 0350   GFRNONAA 53 (L) 05/03/2018 0350   GFRAA >60 05/03/2018 0350   CrCl cannot be calculated (Patient's most recent lab result is older than the maximum 21 days allowed.).  COAG Lab Results  Component Value Date   INR 1.05 04/30/2018   INR 1.01 03/08/2010    Radiology No results found.   Assessment/Plan 1. Bilateral carotid artery stenosis Recommend:  Given the patient's asymptomatic subcritical stenosis no further invasive testing or surgery at this time.  Duplex ultrasound shows <30% stenosis bilaterally.  She is s/p successful left CEA.  Given her minimal stenosis if she remains this good in a year then I will plan for PRN  Continue antiplatelet therapy as prescribed Continue management of CAD, HTN and Hyperlipidemia Healthy heart diet,  encouraged exercise at least 4 times per week  Follow up in 12 months with duplex ultrasound and physical exam   - VAS US CAROTID; Future  2.  Essential hypertension Continue antihypertensive medications as already ordered, these medications have been reviewed and there are no changes at this time.  3. Coronary artery disease of native artery of native heart with stable angina pectoris (HCC) Continue cardiac and antihypertensive medications as already ordered and reviewed, no changes at this time.  Continue statin as ordered and reviewed, no changes at this time  Nitrates PRN for chest pain   4. Mixed hyperlipidemia Continue statin as ordered and reviewed, no changes at this time    Levora Dredge, MD  11/26/2018 10:12 AM

## 2018-12-12 DIAGNOSIS — I6523 Occlusion and stenosis of bilateral carotid arteries: Secondary | ICD-10-CM | POA: Diagnosis not present

## 2018-12-12 DIAGNOSIS — E782 Mixed hyperlipidemia: Secondary | ICD-10-CM | POA: Diagnosis not present

## 2018-12-12 DIAGNOSIS — I10 Essential (primary) hypertension: Secondary | ICD-10-CM | POA: Diagnosis not present

## 2018-12-12 DIAGNOSIS — I2510 Atherosclerotic heart disease of native coronary artery without angina pectoris: Secondary | ICD-10-CM | POA: Diagnosis not present

## 2018-12-12 DIAGNOSIS — R42 Dizziness and giddiness: Secondary | ICD-10-CM | POA: Diagnosis not present

## 2019-01-17 ENCOUNTER — Other Ambulatory Visit: Payer: Self-pay

## 2019-03-22 DIAGNOSIS — E785 Hyperlipidemia, unspecified: Secondary | ICD-10-CM | POA: Diagnosis not present

## 2019-03-22 DIAGNOSIS — I10 Essential (primary) hypertension: Secondary | ICD-10-CM | POA: Diagnosis not present

## 2019-03-25 DIAGNOSIS — I10 Essential (primary) hypertension: Secondary | ICD-10-CM | POA: Diagnosis not present

## 2019-03-25 DIAGNOSIS — E785 Hyperlipidemia, unspecified: Secondary | ICD-10-CM | POA: Diagnosis not present

## 2019-07-04 DIAGNOSIS — I6523 Occlusion and stenosis of bilateral carotid arteries: Secondary | ICD-10-CM | POA: Diagnosis not present

## 2019-07-04 DIAGNOSIS — E782 Mixed hyperlipidemia: Secondary | ICD-10-CM | POA: Diagnosis not present

## 2019-07-04 DIAGNOSIS — I2510 Atherosclerotic heart disease of native coronary artery without angina pectoris: Secondary | ICD-10-CM | POA: Diagnosis not present

## 2019-07-04 DIAGNOSIS — I10 Essential (primary) hypertension: Secondary | ICD-10-CM | POA: Diagnosis not present

## 2019-08-19 DIAGNOSIS — E785 Hyperlipidemia, unspecified: Secondary | ICD-10-CM | POA: Diagnosis not present

## 2019-08-19 DIAGNOSIS — I10 Essential (primary) hypertension: Secondary | ICD-10-CM | POA: Diagnosis not present

## 2019-11-28 ENCOUNTER — Ambulatory Visit (INDEPENDENT_AMBULATORY_CARE_PROVIDER_SITE_OTHER): Payer: PPO | Admitting: Vascular Surgery

## 2019-11-28 ENCOUNTER — Encounter (INDEPENDENT_AMBULATORY_CARE_PROVIDER_SITE_OTHER): Payer: PPO

## 2020-10-26 ENCOUNTER — Other Ambulatory Visit: Payer: Self-pay

## 2020-10-26 ENCOUNTER — Ambulatory Visit: Payer: Medicare Other | Attending: Internal Medicine

## 2020-10-26 DIAGNOSIS — R2681 Unsteadiness on feet: Secondary | ICD-10-CM

## 2020-10-26 DIAGNOSIS — R2689 Other abnormalities of gait and mobility: Secondary | ICD-10-CM | POA: Insufficient documentation

## 2020-10-26 DIAGNOSIS — R262 Difficulty in walking, not elsewhere classified: Secondary | ICD-10-CM | POA: Diagnosis not present

## 2020-10-26 DIAGNOSIS — M6281 Muscle weakness (generalized): Secondary | ICD-10-CM | POA: Diagnosis present

## 2020-10-26 NOTE — Therapy (Addendum)
[facility] PHYS[facility] SPORTS MEDICINE [address] [place], [address] Phone: [phone]   Fax:  [phone]  Physical Therapy Evaluation  Patient Details  Name: Cindy Mcdonald MRN: [id number] Date of Birth: 1930/05/09 No data recorded  Encounter Date: 10/26/2020   PT End of Session - 10/26/20 1552    Visit Number 1    Number of Visits 12    Date for PT Re-Evaluation 12/07/20    Authorization Type Medicare    Authorization - Visit Number 1    Authorization - Number of Visits 10    Progress Note Due on Visit 10    PT Start Time 1435    PT Stop Time 1535    PT Time Calculation (min) 60 min    Equipment Utilized During Treatment Gait belt    Activity Tolerance Patient tolerated treatment well;Patient limited by fatigue           Past Medical History:  Diagnosis Date  . Arthritis   . Carotid artery occlusion   . Carotid artery occlusion   . Complication of anesthesia    difficulty waking up for 3 days after back surgery  . Hyperlipidemia   . Hypertension     Past Surgical History:  Procedure Laterality Date  . BACK SURGERY    . CAROTID STENT    . CATARACT EXTRACTION, BILATERAL Bilateral   . ENDARTERECTOMY Left 05/02/2018   Procedure: ENDARTERECTOMY CAROTID;  Surgeon: Renford Dills, MD;  Location: [facility] ORS;  Service: Vascular;  Laterality: Left;  . JOINT REPLACEMENT    . REPLACEMENT TOTAL HIP W/  RESURFACING IMPLANTS     times two  . TONSILLECTOMY    . TOTAL SHOULDER REPLACEMENT Right     There were no vitals filed for this visit.    Subjective Assessment - 10/26/20 1431    Subjective Pt states she is having difficulty going up and down stairs, difficulty getting up and down from chair, has fallen a few times lately.  The most recent fall was around Christmas- she fell while slipping 2x in the same week.  She was able to pull herself up off the floor after one of the falls.  She feels like her furniture slides  on the hardwood.  She has 4-5 steps to enter the home with a handrail on one side.  Once inside she is on a single level home.  She has a dog and needs to be able to go outside with her dog to take her to the bathroom.  The dog sometimes can jump and knocks her backwards.  She is not driving anymore, her family drives her places now.  She has 2 daughters that are involved in her care.  She has h/o hip fx 4-5 years ago, and hasn't had a full return of stamina/strength after that.  She has some occasional dizziness, but she reports it is better in the past 2 weeks.  She was referred to PT by her PCP, her daughter advocated for her to come to PT.  She is primarily sedentary at home, and uses a recliner chair that assists her with getting up from sitting.  She is not very physically active.    Pertinent History per pt's daughter- pt has memory issues, she has fallen 3x in the past year, 1x she could not get up on her own.    Limitations Standing;Walking;Other (comment)   getting up from chair   How long can you walk  comfortably? 10-15 ft from recliner to pantry    Patient Stated Goals Pt would like to be able to get up from her chair more easily; also to be able to walk without falling, going up/down stairs to get in her home and to take her dog outside.    Currently in Pain? No/denies              Additional Objective Findings: Observation: pt requires multiple attempts to rise from chair with b/l UE support in waiting room  Gait: pt amb with SPC into treatment room, deviates from straight path, and reaches to hold furniture/wall/door frames  Strength: (MMT scored 0-5) Hip Flexion: L/R 4 Knee Extension: L/R: 4+ Sitting abduction: L/R 3+ Adductors: L/R 4+ Knee flexion: L/R 4 Dorsiflexors: L/R4 Plantarflexors: L/R4  Gait/Balance/Functional Tests: 5x STS- 14.8 norm for (80-89):  Pt performed from  elevated table height (22.5 in)- 44sec w use of hands TUG-  28 sec w cane; 26 sec without  cane 86m walk test (goal <.94 m/s)- 15 sec, 14 sec- .2m/s   Vitals at end of session: Pulse ox- 93% HR 96bpm   Objective measurements completed on examination: See above findings.     Treatment: pt education (patient and her daughter Selena Batten) regarding appropriate footwear for amb on hardwood floors; HEP instruction and pt practice- 5x STS from standard height chair (put used b/l UE support on chair arm rests)     PT Education - 10/26/20 1550    Education Details Pt ed (and pt's daughter) footwear, using kitchen chair for meals and then doing her 5x STS HEP 3x/day at the chair, purpose of PT/benefits of treatment    Person(s) Educated Patient;Child(ren)    Methods Explanation;Demonstration;Verbal cues    Comprehension Verbalized understanding;Need further instruction            PT Short Term Goals - 10/26/20 1615      PT SHORT TERM GOAL #1   Title Perform 5xSTS test without use of UE    Baseline Unable to complete test without use of UE assistance    Time 3    Period Weeks    Status New    Target Date 11/16/20      PT SHORT TERM GOAL #2   Title Ambulate up and down 4-5 steps with use of single railing to get in and out of house independently    Baseline Unable    Time 3    Period Weeks    Status New    Target Date 11/16/20             PT Long Term Goals - 10/26/20 1622      PT LONG TERM GOAL #1   Title Improve 5xSTS time to 24 sec without use of UE from a standard chair to decrease risk of falls    Baseline 10/26/20; 44sec (22in seat, use of UE) <14.8s is indicative of decreased fall risk in this demographic    Time 6    Period Weeks    Status New    Target Date 12/07/20      PT LONG TERM GOAL #2   Title Improve TUG time to 20 sec to reduce risk of falls    Baseline 10/26/20; 28sec (use of cane) <20s is appropritae for community dwelling older adult    Time 6    Period Weeks    Status New    Target Date 12/07/20      PT LONG TERM GOAL #3  Title  Increase gait speed during 69m walk test to .11m/s to decrease risk of falls    Baseline 10/26/20; .37m/s (14s for test)    Time 6    Period Weeks    Status New    Target Date 12/07/20      PT LONG TERM GOAL #4   Title Increase hip abductor strength to 4/5 strength of greater to decrease fall risk and improve performance in STS independence    Baseline 10/26/20; 3+/5 (sitting EOB)    Time 6    Period Weeks    Status New    Target Date 12/07/20                  Plan - 10/26/20 1558    Clinical Impression Statement Pt is an 87 y/o F who presents to PT for an initial evaluation today.  She is an appropriate candidate for skilled PT to address LE strength deficits, decreased balance, difficulty with transfers (especially sit to stand), and gait abnormalities.  She has a supportive family member here with her today (daughter), and has access to transportation for appointments at the clinic.  She should do well with a course of physical therapy.    Personal Factors and Comorbidities Age;Fitness;Comorbidity 2    Comorbidities cardiac, hx of hip fx    Examination-Activity Limitations Bend;Locomotion Level;Transfers;Stand;Stairs;Squat;Sleep    Examination-Participation Restrictions Community Activity;Cleaning;Laundry;Meal Prep;Yard Work;Other    Stability/Clinical Decision Making Evolving/Moderate complexity    Clinical Decision Making Moderate    Rehab Potential Good    PT Frequency 2x / week    PT Duration 6 weeks    PT Treatment/Interventions ADLs/Self Care Home Management;Cryotherapy;Electrical Stimulation;Gait training;Stair training;Functional mobility training;Therapeutic activities;Therapeutic exercise;Balance training;Neuromuscular re-education;Patient/family education;Manual techniques;Passive range of motion;Dry needling;Energy conservation;Vestibular    PT Next Visit Plan Lower Extremety strengthening, Balance testing, 6 MWT, begin stair training    PT Home Exercise Plan 5x  STS, 3x/day from kitchen chair    Consulted and Agree with Plan of Care Patient;Family member/caregiver           Patient will benefit from skilled therapeutic intervention in order to improve the following deficits and impairments:  Abnormal gait,Decreased endurance,Decreased balance,Decreased mobility,Difficulty walking,Decreased activity tolerance,Decreased strength,Cardiopulmonary status limiting activity,Decreased cognition,Decreased range of motion,Dizziness,Postural dysfunction,Decreased safety awareness  Visit Diagnosis: Difficulty in walking, not elsewhere classified - Plan: PT plan of care cert/re-cert  Muscle weakness (generalized) - Plan: PT plan of care cert/re-cert  Unsteadiness on feet - Plan: PT plan of care cert/re-cert  Other abnormalities of gait and mobility - Plan: PT plan of care cert/re-cert     Problem List Patient Active Problem List   Diagnosis Date Noted  . Carotid stenosis, asymptomatic, left 05/02/2018  . Carotid stenosis 03/08/2018  . Essential hypertension 03/08/2018  . CAD (coronary artery disease) 03/08/2018  . Hyperlipidemia 03/08/2018    Ardine Bjork 10/27/2020, 3:26 PM Max Fickle, PT, DPT Physical Therapist - [facility]  Outpatient Physical Therapy- Main Campus [phone]    [facility] PHYSICAL AND SPORTS MEDICINE [address], [place], [address] Phone: [phone]   Fax:  [phone]  Name: Cindy Mcdonald MRN: [id number] Date of Birth: 29-Mar-1930

## 2020-10-29 ENCOUNTER — Ambulatory Visit: Payer: Medicare Other

## 2020-10-29 ENCOUNTER — Other Ambulatory Visit: Payer: Self-pay

## 2020-10-29 DIAGNOSIS — R2681 Unsteadiness on feet: Secondary | ICD-10-CM

## 2020-10-29 DIAGNOSIS — R262 Difficulty in walking, not elsewhere classified: Secondary | ICD-10-CM

## 2020-10-29 DIAGNOSIS — M6281 Muscle weakness (generalized): Secondary | ICD-10-CM

## 2020-10-29 NOTE — Therapy (Signed)
[place] [facility] PHYSICAL AND SPORTS MEDICINE [address] [place], [address] Phone: [phone]   Fax:  [phone]  Physical Therapy Treatment  Patient Details  Name: Cindy Mcdonald MRN: [id number] Date of Birth: 11-Apr-1930 No data recorded  Encounter Date: 10/29/2020   PT End of Session - 10/29/20 1542    Visit Number 2    Number of Visits 12    Date for PT Re-Evaluation 12/07/20    Authorization Type Medicare    Authorization - Visit Number 2    Authorization - Number of Visits 10    Progress Note Due on Visit 10    PT Start Time 1345    PT Stop Time 1430    PT Time Calculation (min) 45 min    Equipment Utilized During Treatment Gait belt    Activity Tolerance Patient tolerated treatment well;Patient limited by fatigue           Past Medical History:  Diagnosis Date  . Arthritis   . Carotid artery occlusion   . Carotid artery occlusion   . Complication of anesthesia    difficulty waking up for 3 days after back surgery  . Hyperlipidemia   . Hypertension     Past Surgical History:  Procedure Laterality Date  . BACK SURGERY    . CAROTID STENT    . CATARACT EXTRACTION, BILATERAL Bilateral   . ENDARTERECTOMY Left 05/02/2018   Procedure: ENDARTERECTOMY CAROTID;  Surgeon: Renford Dills, MD;  Location: [facility] ORS;  Service: Vascular;  Laterality: Left;  . JOINT REPLACEMENT    . REPLACEMENT TOTAL HIP W/  RESURFACING IMPLANTS     times two  . TONSILLECTOMY    . TOTAL SHOULDER REPLACEMENT Right     There were no vitals filed for this visit.   Subjective Assessment - 10/29/20 1349    Subjective Pt reports feeling good today. No increase in pain or fatigue from HEP. Has been doing the exercises every day at home.    Patient is accompained by: Family member    Pertinent History per pt's daughter- pt has memory issues, she has fallen 3x in the past year, 1x she could not get up on her own.    Limitations  Standing;Walking;Other (comment)   getting up from chair   How long can you walk comfortably? 10-15 ft from recliner to pantry    Patient Stated Goals Pt would like to be able to get up from her chair more easily; also to be able to walk without falling, going up/down stairs to get in her home and to take her dog outside.    Currently in Pain? No/denies          Therapeutic Exercise   - 700 ft.  Stair walking- 5x- using only left hand, rest at 3 Sit to stand- 5x with no use of UE (PT assistance with gait belt, and proper cuing)  Seated Hip abduction- 3x10 RTB around knees Side stepping- 2x10 steps (SpO2: 98%, 105bpm end of session)    PT Education - 10/29/20 1833    Education Details form/technique with exercise    Person(s) Educated Patient;Child(ren)    Methods Explanation;Demonstration    Comprehension Returned demonstration;Verbalized understanding            PT Short Term Goals - 10/26/20 1615      PT SHORT TERM GOAL #1   Title Perform 5xSTS test without use of UE    Baseline Unable to complete test  without use of UE assistance    Time 3    Period Weeks    Status New    Target Date 11/16/20      PT SHORT TERM GOAL #2   Title Ambulate up and down 4-5 steps with use of single railing to get in and out of house independently    Baseline Unable    Time 3    Period Weeks    Status New    Target Date 11/16/20             PT Long Term Goals - 10/29/20 1540      PT LONG TERM GOAL #1   Title Improve 5xSTS time to 24 sec without use of UE from a standard chair to decrease risk of falls    Baseline 10/26/20; 44sec (22in seat, use of UE) <14.8s is indicative of decreased fall risk in this demographic    Time 6    Period Weeks    Status New      PT LONG TERM GOAL #2   Title Improve TUG time to 20 sec to reduce risk of falls    Baseline 10/26/20; 28sec (use of cane) <20s is appropritae for community dwelling older adult    Time 6    Period Weeks    Status New       PT LONG TERM GOAL #3   Title Increase gait speed during 53m walk test to .50m/s to decrease risk of falls    Baseline 10/26/20; .7m/s (14s for test)    Time 6    Period Weeks    Status New      PT LONG TERM GOAL #4   Title Increase hip abductor strength to 4/5 strength of greater to decrease fall risk and improve performance in STS independence    Baseline 10/26/20; 3+/5 (sitting EOB)    Time 6    Period Weeks    Status New      PT LONG TERM GOAL #5   Title Improve distance to 929ft to show improvment in LE endurance and reduce fall risk    Baseline 10/29/20- 718ft    Time 6    Period Weeks    Status New    Target Date 12/07/20                 Plan - 10/29/20 1526    Clinical Impression Statement Futher evaluation of LE endurance and strength today. Patient was able to walk 764ft for the and was able to go up and down 4 steps, 3 times before fatigue. Patient improved sit to stand ability but still required the use of UE assistance. Patient will benefit from further skilled therapy to immprove LE strength and endurance.    Personal Factors and Comorbidities Age;Fitness;Comorbidity 2    Comorbidities cardiac, hx of hip fx    Examination-Activity Limitations Bend;Locomotion Level;Transfers;Stand;Stairs;Squat;Sleep    Examination-Participation Restrictions Community Activity;Cleaning;Laundry;Meal Prep;Yard Work;Other    Stability/Clinical Decision Making Evolving/Moderate complexity    Rehab Potential Good    PT Frequency 2x / week    PT Duration 6 weeks    PT Treatment/Interventions ADLs/Self Care Home Management;Cryotherapy;Electrical Stimulation;Gait training;Stair training;Functional mobility training;Therapeutic activities;Therapeutic exercise;Balance training;Neuromuscular re-education;Patient/family education;Manual techniques;Passive range of motion;Dry needling;Energy conservation;Vestibular    PT Next Visit Plan Lower Extremety strengthening, Balance  testing, 6 MWT, begin stair training    PT Home Exercise Plan 5x STS, 3x/day from kitchen chair    Consulted and Agree with Plan  of Care Patient;Family member/caregiver           Patient will benefit from skilled therapeutic intervention in order to improve the following deficits and impairments:  Abnormal gait,Decreased endurance,Decreased balance,Decreased mobility,Difficulty walking,Decreased activity tolerance,Decreased strength,Cardiopulmonary status limiting activity,Decreased cognition,Decreased range of motion,Dizziness,Postural dysfunction,Decreased safety awareness  Visit Diagnosis: Difficulty in walking, not elsewhere classified  Muscle weakness (generalized)  Unsteadiness on feet     Problem List Patient Active Problem List   Diagnosis Date Noted  . Carotid stenosis, asymptomatic, left 05/02/2018  . Carotid stenosis 03/08/2018  . Essential hypertension 03/08/2018  . CAD (coronary artery disease) 03/08/2018  . Hyperlipidemia 03/08/2018    Silvano Rusk, SPT 10/29/2020, 6:34 PM  [place] [facility] PHYSICAL AND SPORTS MEDICINE [address], [place], [address] Phone: [phone]   Fax:  [phone]  Name: Cindy Mcdonald MRN: [id number] Date of Birth: April 27, 1930

## 2020-11-02 ENCOUNTER — Other Ambulatory Visit: Payer: Self-pay

## 2020-11-02 ENCOUNTER — Ambulatory Visit: Payer: Medicare Other

## 2020-11-02 DIAGNOSIS — R262 Difficulty in walking, not elsewhere classified: Secondary | ICD-10-CM

## 2020-11-02 DIAGNOSIS — R2681 Unsteadiness on feet: Secondary | ICD-10-CM

## 2020-11-02 DIAGNOSIS — M6281 Muscle weakness (generalized): Secondary | ICD-10-CM

## 2020-11-02 DIAGNOSIS — R2689 Other abnormalities of gait and mobility: Secondary | ICD-10-CM

## 2020-11-02 NOTE — Therapy (Signed)
[place] [facility] [address] [place], [address] Phone: [phone]   Fax:  [phone]  Physical Therapy Treatment  Patient Details  Name: Cindy Mcdonald MRN: [id number] Date of Birth: [birth year] No data recorded  Encounter Date: 11/02/2020   PT End of Session - 11/02/20 1526    Visit Number 3    Number of Visits 12    Date for PT Re-Evaluation 12/07/20    Authorization Type Medicare    Authorization - Visit Number 2    Authorization - Number of Visits 10    Progress Note Due on Visit 10    PT Start Time 1345    PT Stop Time 1430    PT Time Calculation (min) 45 min    Equipment Utilized During Treatment Gait belt    Activity Tolerance Patient tolerated treatment well;Patient limited by fatigue           Past Medical History:  Diagnosis Date  . Arthritis   . Carotid artery occlusion   . Carotid artery occlusion   . Complication of anesthesia    difficulty waking up for 3 days after back surgery  . Hyperlipidemia   . Hypertension     Past Surgical History:  Procedure Laterality Date  . BACK SURGERY    . CAROTID STENT    . CATARACT EXTRACTION, BILATERAL Bilateral   . ENDARTERECTOMY Left 05/02/2018   Procedure: ENDARTERECTOMY CAROTID;  Surgeon: Renford Dills, MD;  Location: [facility] ORS;  Service: Vascular;  Laterality: Left;  . JOINT REPLACEMENT    . REPLACEMENT TOTAL HIP W/  RESURFACING IMPLANTS     times two  . TONSILLECTOMY    . TOTAL SHOULDER REPLACEMENT Right     There were no vitals filed for this visit.   Subjective Assessment - 11/02/20 1351    Subjective Pt. reports no increased soreness from exercises. Pt. states HEP is going well.    Patient is accompained by: Family member    Pertinent History per pt's daughter- pt has memory issues, she has fallen 3x in the past year, 1x she could not get up on her own.    Limitations Standing;Walking;Other (comment)   getting up from chair    How long can you walk comfortably? 10-15 ft from recliner to pantry    Patient Stated Goals Pt would like to be able to get up from her chair more easily; also to be able to walk without falling, going up/down stairs to get in her home and to take her dog outside.            Therapeutic exercise Sit to stand- 5x with no use of UE (PT assistance with gait belt, and proper cuing)  Seated Hip abduction- 3x10 GTB around knees Side stepping- 2x10 steps Walking without Assistive device- 3x15 ft- weaving between cones  Therex done to address LE weakness and balance       PT Education - 11/02/20 1521    Education Details form/technique with exercise    Person(s) Educated Child(ren);Patient    Methods Explanation;Demonstration    Comprehension Verbalized understanding;Returned demonstration            PT Short Term Goals - 10/26/20 1615      PT SHORT TERM GOAL #1   Title Perform 5xSTS test without use of UE    Baseline Unable to complete test without use of UE assistance    Time 3    Period Weeks  Status New    Target Date 11/16/20      PT SHORT TERM GOAL #2   Title Ambulate up and down 4-5 steps with use of single railing to get in and out of house independently    Baseline Unable    Time 3    Period Weeks    Status New    Target Date 11/16/20             PT Long Term Goals - 10/29/20 1540      PT LONG TERM GOAL #1   Title Improve 5xSTS time to 24 sec without use of UE from a standard chair to decrease risk of falls    Baseline 10/26/20; 44sec (22in seat, use of UE) <14.8s is indicative of decreased fall risk in this demographic    Time 6    Period Weeks    Status New      PT LONG TERM GOAL #2   Title Improve TUG time to 20 sec to reduce risk of falls    Baseline 10/26/20; 28sec (use of cane) <20s is appropritae for community dwelling older adult    Time 6    Period Weeks    Status New      PT LONG TERM GOAL #3   Title Increase gait speed during 30m walk  test to .82m/s to decrease risk of falls    Baseline 10/26/20; .23m/s (14s for test)    Time 6    Period Weeks    Status New      PT LONG TERM GOAL #4   Title Increase hip abductor strength to 4/5 strength of greater to decrease fall risk and improve performance in STS independence    Baseline 10/26/20; 3+/5 (sitting EOB)    Time 6    Period Weeks    Status New      PT LONG TERM GOAL #5   Title Improve distance to 957ft to show improvment in LE endurance and reduce fall risk    Baseline 10/29/20- 733ft    Time 6    Period Weeks    Status New    Target Date 12/07/20                 Plan - 11/02/20 1521    Clinical Impression Statement Focused on LE strength and endurance today. Cueing given to patient for getting out of chair without use of UE for assistance. Patient was able to perform a sit to stand woith the assitance of 2 fingers. PT educated patient on proper stair usage for home. Patient was able to walk and weave through cones without use of assitive device, but fatigued quickly. Patient will benefit from further skilled therapy to improve LE strength and endurance.    Personal Factors and Comorbidities Age;Fitness;Comorbidity 2    Comorbidities cardiac, hx of hip fx    Examination-Activity Limitations Bend;Locomotion Level;Transfers;Stand;Stairs;Squat;Sleep    Examination-Participation Restrictions Community Activity;Cleaning;Laundry;Meal Prep;Yard Work;Other    Stability/Clinical Decision Making Evolving/Moderate complexity    Rehab Potential Good    PT Frequency 2x / week    PT Duration 6 weeks    PT Treatment/Interventions ADLs/Self Care Home Management;Cryotherapy;Electrical Stimulation;Gait training;Stair training;Functional mobility training;Therapeutic activities;Therapeutic exercise;Balance training;Neuromuscular re-education;Patient/family education;Manual techniques;Passive range of motion;Dry needling;Energy conservation;Vestibular    PT Next Visit Plan  Lower Extremety strengthening, Balance testing, 6 MWT, begin stair training    PT Home Exercise Plan 5x STS, 3x/day from kitchen chair    Consulted and Agree with Plan  of Care Patient;Family member/caregiver           Patient will benefit from skilled therapeutic intervention in order to improve the following deficits and impairments:  Abnormal gait,Decreased endurance,Decreased balance,Decreased mobility,Difficulty walking,Decreased activity tolerance,Decreased strength,Cardiopulmonary status limiting activity,Decreased cognition,Decreased range of motion,Dizziness,Postural dysfunction,Decreased safety awareness  Visit Diagnosis: Muscle weakness (generalized)  Difficulty in walking, not elsewhere classified  Unsteadiness on feet  Other abnormalities of gait and mobility     Problem List Patient Active Problem List   Diagnosis Date Noted  . Carotid stenosis, asymptomatic, left 05/02/2018  . Carotid stenosis 03/08/2018  . Essential hypertension 03/08/2018  . CAD (coronary artery disease) 03/08/2018  . Hyperlipidemia 03/08/2018    Silvano Rusk SPT 11/02/2020, 3:27 PM  [place] [facility] [address], [place], [address] Phone: [phone]   Fax:  [phone]  Name: Cindy Mcdonald MRN: [id number] Date of Birth: [birth year]

## 2020-11-05 ENCOUNTER — Ambulatory Visit: Payer: Medicare HMO | Attending: Internal Medicine

## 2020-11-05 ENCOUNTER — Other Ambulatory Visit: Payer: Self-pay

## 2020-11-05 DIAGNOSIS — M6281 Muscle weakness (generalized): Secondary | ICD-10-CM

## 2020-11-05 DIAGNOSIS — R262 Difficulty in walking, not elsewhere classified: Secondary | ICD-10-CM | POA: Diagnosis present

## 2020-11-05 DIAGNOSIS — R2681 Unsteadiness on feet: Secondary | ICD-10-CM | POA: Diagnosis present

## 2020-11-05 DIAGNOSIS — R2689 Other abnormalities of gait and mobility: Secondary | ICD-10-CM | POA: Diagnosis present

## 2020-11-05 NOTE — Therapy (Signed)
[place] [facility] [address] [place], [address] Phone: [phone]   Fax:  [phone]  Physical Therapy Treatment  Patient Details  Name: Cindy Mcdonald MRN: [id number] Date of Birth: 03/16/1930 No data recorded  Encounter Date: 11/05/2020   PT End of Session - 11/05/20 1411    Visit Number 4    Number of Visits 12    Date for PT Re-Evaluation 12/07/20    Authorization Type Medicare    Authorization - Visit Number 2    Authorization - Number of Visits 10    Progress Note Due on Visit 10    PT Start Time 1300    PT Stop Time 1345    PT Time Calculation (min) 45 min    Equipment Utilized During Treatment Gait belt    Activity Tolerance Patient tolerated treatment well;Patient limited by fatigue           Past Medical History:  Diagnosis Date  . Arthritis   . Carotid artery occlusion   . Carotid artery occlusion   . Complication of anesthesia    difficulty waking up for 3 days after back surgery  . Hyperlipidemia   . Hypertension     Past Surgical History:  Procedure Laterality Date  . BACK SURGERY    . CAROTID STENT    . CATARACT EXTRACTION, BILATERAL Bilateral   . ENDARTERECTOMY Left 05/02/2018   Procedure: ENDARTERECTOMY CAROTID;  Surgeon: Renford Dills, MD;  Location: [facility];  Service: Vascular;  Laterality: Left;  . JOINT REPLACEMENT    . REPLACEMENT TOTAL HIP W/  RESURFACING IMPLANTS     times two  . TONSILLECTOMY    . TOTAL SHOULDER REPLACEMENT Right     There were no vitals filed for this visit.   Subjective Assessment - 11/05/20 1304    Subjective Pt. reports no changes from last session. HEP is going well at home.    Patient is accompained by: Family member    Pertinent History per pt's daughter- pt has memory issues, she has fallen 3x in the past year, 1x she could not get up on her own.    Limitations Standing;Walking;Other (comment)   getting up from chair   How long  can you walk comfortably? 10-15 ft from recliner to pantry    Patient Stated Goals Pt would like to be able to get up from her chair more easily; also to be able to walk without falling, going up/down stairs to get in her home and to take her dog outside.           Therapeutic Exercise  Sit to stand- 2x5 Stair walking x 4-leading with each foot 2x Step up without UE support- 3x each leg Lateral stepping- 2x10 YTB around knees Walking without cane- 236ft- walking fast on straights   Performed to improve LE strength and endurance     PT Education - 11/05/20 1350    Education Details form/technique with exercise    Person(s) Educated Patient;Child(ren)    Methods Explanation;Demonstration    Comprehension Verbalized understanding;Returned demonstration            PT Short Term Goals - 10/26/20 1615      PT SHORT TERM GOAL #1   Title Perform 5xSTS test without use of UE    Baseline Unable to complete test without use of UE assistance    Time 3    Period Weeks    Status New  Target Date 11/16/20      PT SHORT TERM GOAL #2   Title Ambulate up and down 4-5 steps with use of single railing to get in and out of house independently    Baseline Unable    Time 3    Period Weeks    Status New    Target Date 11/16/20             PT Long Term Goals - 10/29/20 1540      PT LONG TERM GOAL #1   Title Improve 5xSTS time to 24 sec without use of UE from a standard chair to decrease risk of falls    Baseline 10/26/20; 44sec (22in seat, use of UE) <14.8s is indicative of decreased fall risk in this demographic    Time 6    Period Weeks    Status New      PT LONG TERM GOAL #2   Title Improve TUG time to 20 sec to reduce risk of falls    Baseline 10/26/20; 28sec (use of cane) <20s is appropritae for community dwelling older adult    Time 6    Period Weeks    Status New      PT LONG TERM GOAL #3   Title Increase gait speed during 63m walk test to .78m/s to decrease risk of  falls    Baseline 10/26/20; .27m/s (14s for test)    Time 6    Period Weeks    Status New      PT LONG TERM GOAL #4   Title Increase hip abductor strength to 4/5 strength of greater to decrease fall risk and improve performance in STS independence    Baseline 10/26/20; 3+/5 (sitting EOB)    Time 6    Period Weeks    Status New      PT LONG TERM GOAL #5   Title Improve distance to 958ft to show improvment in LE endurance and reduce fall risk    Baseline 10/29/20- 784ft    Time 6    Period Weeks    Status New    Target Date 12/07/20                 Plan - 11/05/20 1350    Clinical Impression Statement Continued focus on LE strength and endurance. Patient was able to ambulate 200 ft without use of assistive device and was able to change speeds. Patient is unable to perform a sit to stand without use of UE support, indicating impaired strength. Patient will benefit from further skilled therapy to improve independene.    Personal Factors and Comorbidities Age;Fitness;Comorbidity 2    Comorbidities cardiac, hx of hip fx    Examination-Activity Limitations Bend;Locomotion Level;Transfers;Stand;Stairs;Squat;Sleep    Examination-Participation Restrictions Community Activity;Cleaning;Laundry;Meal Prep;Yard Work;Other    Stability/Clinical Decision Making Evolving/Moderate complexity    Rehab Potential Good    PT Frequency 2x / week    PT Duration 6 weeks    PT Treatment/Interventions ADLs/Self Care Home Management;Cryotherapy;Electrical Stimulation;Gait training;Stair training;Functional mobility training;Therapeutic activities;Therapeutic exercise;Balance training;Neuromuscular re-education;Patient/family education;Manual techniques;Passive range of motion;Dry needling;Energy conservation;Vestibular    PT Next Visit Plan Lower Extremety strengthening, Balance testing, 6 MWT, begin stair training    PT Home Exercise Plan 5x STS, 3x/day from kitchen chair    Consulted and Agree with  Plan of Care Patient;Family member/caregiver           Patient will benefit from skilled therapeutic intervention in order to improve the following deficits and  impairments:  Abnormal gait,Decreased endurance,Decreased balance,Decreased mobility,Difficulty walking,Decreased activity tolerance,Decreased strength,Cardiopulmonary status limiting activity,Decreased cognition,Decreased range of motion,Dizziness,Postural dysfunction,Decreased safety awareness  Visit Diagnosis: Muscle weakness (generalized)  Difficulty in walking, not elsewhere classified  Unsteadiness on feet  Other abnormalities of gait and mobility     Problem List Patient Active Problem List   Diagnosis Date Noted  . Carotid stenosis, asymptomatic, left 05/02/2018  . Carotid stenosis 03/08/2018  . Essential hypertension 03/08/2018  . CAD (coronary artery disease) 03/08/2018  . Hyperlipidemia 03/08/2018    Silvano Rusk SPT 11/05/2020, 2:15 PM  [place] [facility] [address], [place], [address] Phone: [phone]   Fax:  [phone]  Name: Cindy Mcdonald MRN: [id number] Date of Birth: 1930-02-13

## 2020-11-17 ENCOUNTER — Ambulatory Visit: Payer: Medicare HMO

## 2020-11-17 ENCOUNTER — Other Ambulatory Visit: Payer: Self-pay

## 2020-11-17 DIAGNOSIS — R2681 Unsteadiness on feet: Secondary | ICD-10-CM

## 2020-11-17 DIAGNOSIS — M6281 Muscle weakness (generalized): Secondary | ICD-10-CM | POA: Diagnosis not present

## 2020-11-17 DIAGNOSIS — R262 Difficulty in walking, not elsewhere classified: Secondary | ICD-10-CM

## 2020-11-17 DIAGNOSIS — R2689 Other abnormalities of gait and mobility: Secondary | ICD-10-CM

## 2020-11-17 NOTE — Therapy (Signed)
[place] [facility] [address], [place], [address] Phone: [phone]   Fax:  [phone]  Physical Therapy Treatment  Patient Details  Name: Cindy Mcdonald MRN: [id number] Date of Birth: 26-Jul-1930 No data recorded  Encounter Date: 11/17/2020   PT End of Session - 11/17/20 1038    Visit Number 5    Number of Visits 12    Date for PT Re-Evaluation 12/07/20    Authorization Type Medicare    Authorization - Visit Number 2    Authorization - Number of Visits 10    Progress Note Due on Visit 10    PT Start Time 0945    PT Stop Time 1030    PT Time Calculation (min) 45 min    Equipment Utilized During Treatment Gait belt    Activity Tolerance Patient tolerated treatment well;Patient limited by fatigue           Past Medical History:  Diagnosis Date  . Arthritis   . Carotid artery occlusion   . Carotid artery occlusion   . Complication of anesthesia    difficulty waking up for 3 days after back surgery  . Hyperlipidemia   . Hypertension     Past Surgical History:  Procedure Laterality Date  . BACK SURGERY    . CAROTID STENT    . CATARACT EXTRACTION, BILATERAL Bilateral   . ENDARTERECTOMY Left 05/02/2018   Procedure: ENDARTERECTOMY CAROTID;  Surgeon: Renford Dills, MD;  Location: [facility];  Service: Vascular;  Laterality: Left;  . JOINT REPLACEMENT    . REPLACEMENT TOTAL HIP W/  RESURFACING IMPLANTS     times two  . TONSILLECTOMY    . TOTAL SHOULDER REPLACEMENT Right     There were no vitals filed for this visit.   Subjective Assessment - 11/17/20 0948    Subjective Pt. reports no changes from last session. HEP is going well at home. No increased soreness from exercises last time.    Patient is accompained by: Family member    Pertinent History per pt's daughter- pt has memory issues, she has fallen 3x in the past year, 1x she could not get up on her own.    Limitations Standing;Walking;Other  (comment)   getting up from chair   How long can you walk comfortably? 10-15 ft from recliner to pantry    Patient Stated Goals Pt would like to be able to get up from her chair more easily; also to be able to walk without falling, going up/down stairs to get in her home and to take her dog outside.    Currently in Pain? No/denies           Therapeutic Exercise  Sit to stand without UE support- 2x5- Foam+pillow Stair walking x 4-leading with each foot 2x Step up without UE support- 3x each leg 4in step Lateral stepping- 2x10 YTB around knees Cone taps with LE- 4x4taps  Walking without cane- 354ft- walking fast on straights   Performed to improve LE strength and endurance       PT Education - 11/17/20 1035    Education Details form/technique with exercise    Person(s) Educated Patient;Child(ren)    Methods Explanation;Demonstration    Comprehension Verbalized understanding;Returned demonstration            PT Short Term Goals - 10/26/20 1615      PT SHORT TERM GOAL #1   Title Perform 5xSTS test without use of UE  Baseline Unable to complete test without use of UE assistance    Time 3    Period Weeks    Status New    Target Date 11/16/20      PT SHORT TERM GOAL #2   Title Ambulate up and down 4-5 steps with use of single railing to get in and out of house independently    Baseline Unable    Time 3    Period Weeks    Status New    Target Date 11/16/20             PT Long Term Goals - 10/29/20 1540      PT LONG TERM GOAL #1   Title Improve 5xSTS time to 24 sec without use of UE from a standard chair to decrease risk of falls    Baseline 10/26/20; 44sec (22in seat, use of UE) <14.8s is indicative of decreased fall risk in this demographic    Time 6    Period Weeks    Status New      PT LONG TERM GOAL #2   Title Improve TUG time to 20 sec to reduce risk of falls    Baseline 10/26/20; 28sec (use of cane) <20s is appropritae for community dwelling older  adult    Time 6    Period Weeks    Status New      PT LONG TERM GOAL #3   Title Increase gait speed during 26m walk test to .79m/s to decrease risk of falls    Baseline 10/26/20; .58m/s (14s for test)    Time 6    Period Weeks    Status New      PT LONG TERM GOAL #4   Title Increase hip abductor strength to 4/5 strength of greater to decrease fall risk and improve performance in STS independence    Baseline 10/26/20; 3+/5 (sitting EOB)    Time 6    Period Weeks    Status New      PT LONG TERM GOAL #5   Title Improve distance to 963ft to show improvment in LE endurance and reduce fall risk    Baseline 10/29/20- 752ft    Time 6    Period Weeks    Status New    Target Date 12/07/20                 Plan - 11/17/20 1036    Clinical Impression Statement Continued to focus on LE strength and balance. Patient ambulated 37ft without use of assistive deveice and was able to change speeds without loss of balance. Patient is hesitant in any motion that does not have UE support avalible showing decreased balance ability. Patient will benefit from further skilled therapy to return to prior level of function    Personal Factors and Comorbidities Age;Fitness;Comorbidity 2    Comorbidities cardiac, hx of hip fx    Examination-Activity Limitations Bend;Locomotion Level;Transfers;Stand;Stairs;Squat;Sleep    Examination-Participation Restrictions Community Activity;Cleaning;Laundry;Meal Prep;Yard Work;Other    Stability/Clinical Decision Making Evolving/Moderate complexity    Rehab Potential Good    PT Frequency 2x / week    PT Duration 6 weeks    PT Treatment/Interventions ADLs/Self Care Home Management;Cryotherapy;Electrical Stimulation;Gait training;Stair training;Functional mobility training;Therapeutic activities;Therapeutic exercise;Balance training;Neuromuscular re-education;Patient/family education;Manual techniques;Passive range of motion;Dry needling;Energy  conservation;Vestibular    PT Next Visit Plan Lower Extremety strengthening, Balance testing, 6 MWT, begin stair training    PT Home Exercise Plan 5x STS, 3x/day from kitchen chair    Consulted  and Agree with Plan of Care Patient;Family member/caregiver           Patient will benefit from skilled therapeutic intervention in order to improve the following deficits and impairments:  Abnormal gait,Decreased endurance,Decreased balance,Decreased mobility,Difficulty walking,Decreased activity tolerance,Decreased strength,Cardiopulmonary status limiting activity,Decreased cognition,Decreased range of motion,Dizziness,Postural dysfunction,Decreased safety awareness  Visit Diagnosis: Muscle weakness (generalized)  Difficulty in walking, not elsewhere classified  Unsteadiness on feet  Other abnormalities of gait and mobility     Problem List Patient Active Problem List   Diagnosis Date Noted  . Carotid stenosis, asymptomatic, left 05/02/2018  . Carotid stenosis 03/08/2018  . Essential hypertension 03/08/2018  . CAD (coronary artery disease) 03/08/2018  . Hyperlipidemia 03/08/2018    Silvano Rusk SPT 11/17/2020, 10:39 AM  [place] [facility] [address] [place], [address] Phone: [phone]   Fax:  [phone]  Name: Cindy Mcdonald MRN: [id number] Date of Birth: [birth year]

## 2020-11-19 ENCOUNTER — Ambulatory Visit: Payer: Medicare HMO

## 2020-11-19 ENCOUNTER — Other Ambulatory Visit: Payer: Self-pay

## 2020-11-19 DIAGNOSIS — M6281 Muscle weakness (generalized): Secondary | ICD-10-CM

## 2020-11-19 DIAGNOSIS — R262 Difficulty in walking, not elsewhere classified: Secondary | ICD-10-CM

## 2020-11-19 DIAGNOSIS — R2681 Unsteadiness on feet: Secondary | ICD-10-CM

## 2020-11-19 DIAGNOSIS — R2689 Other abnormalities of gait and mobility: Secondary | ICD-10-CM

## 2020-11-19 NOTE — Therapy (Signed)
[facility] [address] [place], [address] Phone: [phone]   Fax:  [phone]  Physical Therapy Treatment  Patient Details  Name: Cindy Mcdonald MRN: [id number] Date of Birth: [birth year] No data recorded  Encounter Date: 11/19/2020   PT End of Session - 11/19/20 0914    Visit Number 6    Number of Visits 12    Date for PT Re-Evaluation 12/07/20    Authorization Type Medicare    Authorization - Visit Number 2    Authorization - Number of Visits 10    Progress Note Due on Visit 10    PT Start Time 0815    PT Stop Time 0900    PT Time Calculation (min) 45 min    Equipment Utilized During Treatment Gait belt    Activity Tolerance Patient tolerated treatment well;Patient limited by fatigue           Past Medical History:  Diagnosis Date  . Arthritis   . Carotid artery occlusion   . Carotid artery occlusion   . Complication of anesthesia    difficulty waking up for 3 days after back surgery  . Hyperlipidemia   . Hypertension     Past Surgical History:  Procedure Laterality Date  . BACK SURGERY    . CAROTID STENT    . CATARACT EXTRACTION, BILATERAL Bilateral   . ENDARTERECTOMY Left 05/02/2018   Procedure: ENDARTERECTOMY CAROTID;  Surgeon: Renford Dills, MD;  Location: [facility] ORS;  Service: Vascular;  Laterality: Left;  . JOINT REPLACEMENT    . REPLACEMENT TOTAL HIP W/  RESURFACING IMPLANTS     times two  . TONSILLECTOMY    . TOTAL SHOULDER REPLACEMENT Right     There were no vitals filed for this visit.   Subjective Assessment - 11/19/20 0906    Subjective Patient reports increased fatigue after last session with more walking. Patient has been walking more with het dog. No pain or soreness today    Patient is accompained by: Family member    Pertinent History per pt's daughter- pt has memory issues, she has fallen 3x in the past year, 1x she could not get up on her own.     Limitations Standing;Walking;Other (comment)   getting up from chair   How long can you walk comfortably? 10-15 ft from recliner to pantry    Patient Stated Goals Pt would like to be able to get up from her chair more easily; also to be able to walk without falling, going up/down stairs to get in her home and to take her dog outside.    Currently in Pain? No/denies           Therapeutic Exercise  Sit to stand without UE support- 2x5- Foam+pillow Stair walking:  4-leading with each foot x2 1 UE support/ 2x no UE support Hurdle walking- 3x46ft- 6hurdles Walking over foam/2 6in steps- 3x22ft Cone taps with LE- 3x6taps  Walking without cane- 496ft- walking fast on straights, obstacles to navigate around  Therex done for LE strength and balance       PT Education - 11/19/20 0908    Education Details form/technique with exercise    Person(s) Educated Patient;Child(ren)    Methods Explanation;Demonstration    Comprehension Verbalized understanding;Returned demonstration            PT Short Term Goals - 10/26/20 1615      PT SHORT TERM GOAL #1   Title Perform  5xSTS test without use of UE    Baseline Unable to complete test without use of UE assistance    Time 3    Period Weeks    Status New    Target Date 11/16/20      PT SHORT TERM GOAL #2   Title Ambulate up and down 4-5 steps with use of single railing to get in and out of house independently    Baseline Unable    Time 3    Period Weeks    Status New    Target Date 11/16/20             PT Long Term Goals - 10/29/20 1540      PT LONG TERM GOAL #1   Title Improve 5xSTS time to 24 sec without use of UE from a standard chair to decrease risk of falls    Baseline 10/26/20; 44sec (22in seat, use of UE) <14.8s is indicative of decreased fall risk in this demographic    Time 6    Period Weeks    Status New      PT LONG TERM GOAL #2   Title Improve TUG time to 20 sec to reduce risk of falls    Baseline 10/26/20;  28sec (use of cane) <20s is appropritae for community dwelling older adult    Time 6    Period Weeks    Status New      PT LONG TERM GOAL #3   Title Increase gait speed during 23m walk test to .22m/s to decrease risk of falls    Baseline 10/26/20; .69m/s (14s for test)    Time 6    Period Weeks    Status New      PT LONG TERM GOAL #4   Title Increase hip abductor strength to 4/5 strength of greater to decrease fall risk and improve performance in STS independence    Baseline 10/26/20; 3+/5 (sitting EOB)    Time 6    Period Weeks    Status New      PT LONG TERM GOAL #5   Title Improve distance to 957ft to show improvment in LE endurance and reduce fall risk    Baseline 10/29/20- 7109ft    Time 6    Period Weeks    Status New    Target Date 12/07/20                 Plan - 11/19/20 0908    Clinical Impression Statement Continued focus on LE strength and balance. Patient ambulated 43ft without use of assitive device and changed speed well, without LOB. Patient showed increased LE endurance and improved balance. Patient lacks confidence when performing a movement for the first time, but has the strength and ability. Patient will benefit from further skilled therapy to maintain level of independence and function.    Personal Factors and Comorbidities Age;Fitness;Comorbidity 2    Comorbidities cardiac, hx of hip fx    Examination-Activity Limitations Bend;Locomotion Level;Transfers;Stand;Stairs;Squat;Sleep    Examination-Participation Restrictions Community Activity;Cleaning;Laundry;Meal Prep;Yard Work;Other    Stability/Clinical Decision Making Evolving/Moderate complexity    Rehab Potential Good    PT Frequency 2x / week    PT Duration 6 weeks    PT Treatment/Interventions ADLs/Self Care Home Management;Cryotherapy;Electrical Stimulation;Gait training;Stair training;Functional mobility training;Therapeutic activities;Therapeutic exercise;Balance training;Neuromuscular  re-education;Patient/family education;Manual techniques;Passive range of motion;Dry needling;Energy conservation;Vestibular    PT Next Visit Plan Lower Extremety strengthening, Balance testing, 6 MWT, begin stair training    PT Home  Exercise Plan 5x STS, 3x/day from kitchen chair    Consulted and Agree with Plan of Care Patient;Family member/caregiver           Patient will benefit from skilled therapeutic intervention in order to improve the following deficits and impairments:  Abnormal gait,Decreased endurance,Decreased balance,Decreased mobility,Difficulty walking,Decreased activity tolerance,Decreased strength,Cardiopulmonary status limiting activity,Decreased cognition,Decreased range of motion,Dizziness,Postural dysfunction,Decreased safety awareness  Visit Diagnosis: Muscle weakness (generalized)  Difficulty in walking, not elsewhere classified  Unsteadiness on feet  Other abnormalities of gait and mobility     Problem List Patient Active Problem List   Diagnosis Date Noted  . Carotid stenosis, asymptomatic, left 05/02/2018  . Carotid stenosis 03/08/2018  . Essential hypertension 03/08/2018  . CAD (coronary artery disease) 03/08/2018  . Hyperlipidemia 03/08/2018    Silvano Rusk SPT 11/19/2020, 9:15 AM  [facility] [address] [place], [address] Phone: [phone]   Fax:  [phone]  Name: Cindy Mcdonald MRN: [id number] Date of Birth: 1929-10-10

## 2020-11-24 ENCOUNTER — Ambulatory Visit: Payer: Medicare HMO

## 2020-11-24 ENCOUNTER — Other Ambulatory Visit: Payer: Self-pay

## 2020-11-24 DIAGNOSIS — M6281 Muscle weakness (generalized): Secondary | ICD-10-CM

## 2020-11-24 DIAGNOSIS — R2689 Other abnormalities of gait and mobility: Secondary | ICD-10-CM

## 2020-11-24 DIAGNOSIS — R2681 Unsteadiness on feet: Secondary | ICD-10-CM

## 2020-11-24 DIAGNOSIS — R262 Difficulty in walking, not elsewhere classified: Secondary | ICD-10-CM

## 2020-11-24 NOTE — Therapy (Signed)
[place] [facility] [address] [place], [address] Phone: [phone]   Fax:  [phone]  Physical Therapy Treatment  Patient Details  Name: Cindy Mcdonald MRN: [id number] Date of Birth: 04-11-1930 No data recorded  Encounter Date: 11/24/2020   PT End of Session - 11/24/20 1131    Visit Number 7    Number of Visits 12    Date for PT Re-Evaluation 12/07/20    Authorization Type Medicare    Authorization - Visit Number 2    Authorization - Number of Visits 10    Progress Note Due on Visit 10    PT Start Time 0930    PT Stop Time 1015    PT Time Calculation (min) 45 min    Equipment Utilized During Treatment Gait belt    Activity Tolerance Patient tolerated treatment well;Patient limited by fatigue           Past Medical History:  Diagnosis Date  . Arthritis   . Carotid artery occlusion   . Carotid artery occlusion   . Complication of anesthesia    difficulty waking up for 3 days after back surgery  . Hyperlipidemia   . Hypertension     Past Surgical History:  Procedure Laterality Date  . BACK SURGERY    . CAROTID STENT    . CATARACT EXTRACTION, BILATERAL Bilateral   . ENDARTERECTOMY Left 05/02/2018   Procedure: ENDARTERECTOMY CAROTID;  Surgeon: Renford Dills, MD;  Location: [facility] ORS;  Service: Vascular;  Laterality: Left;  . JOINT REPLACEMENT    . REPLACEMENT TOTAL HIP W/  RESURFACING IMPLANTS     times two  . TONSILLECTOMY    . TOTAL SHOULDER REPLACEMENT Right     There were no vitals filed for this visit.   Subjective Assessment - 11/24/20 1126    Subjective Patients daugter states that patient had been worried about her balance a little bit more today then usual. Patient states she was feeling fatigued at start of session.    Patient is accompained by: Family member    Pertinent History per pt's daughter- pt has memory issues, she has fallen 3x in the past year, 1x she could not get  up on her own.    Limitations Standing;Walking;Other (comment)   getting up from chair   How long can you walk comfortably? 10-15 ft from recliner to pantry    Patient Stated Goals Pt would like to be able to get up from her chair more easily; also to be able to walk without falling, going up/down stairs to get in her home and to take her dog outside.    Currently in Pain? No/denies           Therapeutic Exercise    Sit to stand without UE support- 2x5- Foam+pillow Stair walking:  4-leading with each foot x2 1 UE support/ 2x no UE support Hurdle walking- Unable to perform today Cone taps with LE- 3x2taps  Walking without cane- 441ft- walking fast on straights, obstacles to navigate around Therex done for LE strength and balance      PT Education - 11/24/20 1127    Education Details form/technique with exercise    Person(s) Educated Patient;Child(ren)    Methods Explanation;Demonstration    Comprehension Verbalized understanding;Returned demonstration            PT Short Term Goals - 10/26/20 1615      PT SHORT TERM GOAL #1   Title  Perform 5xSTS test without use of UE    Baseline Unable to complete test without use of UE assistance    Time 3    Period Weeks    Status New    Target Date 11/16/20      PT SHORT TERM GOAL #2   Title Ambulate up and down 4-5 steps with use of single railing to get in and out of house independently    Baseline Unable    Time 3    Period Weeks    Status New    Target Date 11/16/20             PT Long Term Goals - 10/29/20 1540      PT LONG TERM GOAL #1   Title Improve 5xSTS time to 24 sec without use of UE from a standard chair to decrease risk of falls    Baseline 10/26/20; 44sec (22in seat, use of UE) <14.8s is indicative of decreased fall risk in this demographic    Time 6    Period Weeks    Status New      PT LONG TERM GOAL #2   Title Improve TUG time to 20 sec to reduce risk of falls    Baseline 10/26/20; 28sec (use of  cane) <20s is appropritae for community dwelling older adult    Time 6    Period Weeks    Status New      PT LONG TERM GOAL #3   Title Increase gait speed during 37m walk test to .8m/s to decrease risk of falls    Baseline 10/26/20; .10m/s (14s for test)    Time 6    Period Weeks    Status New      PT LONG TERM GOAL #4   Title Increase hip abductor strength to 4/5 strength of greater to decrease fall risk and improve performance in STS independence    Baseline 10/26/20; 3+/5 (sitting EOB)    Time 6    Period Weeks    Status New      PT LONG TERM GOAL #5   Title Improve distance to 975ft to show improvment in LE endurance and reduce fall risk    Baseline 10/29/20- 736ft    Time 6    Period Weeks    Status New    Target Date 12/07/20                 Plan - 11/24/20 1127    Clinical Impression Statement Continued focus on LE strength and balance. Patient ambulated 473ft without use of assitive device and perfored change of speed without LOB. Patient showed increased hesitance to perform exercise today. She was very nervous about performing hurdle walking, and standing march. Patient showed increased confidence and ability to perform exercise after cueing and progressive exercise. Patient will benefit from futher skilled therapu to return to prior level of function.    Personal Factors and Comorbidities Age;Fitness;Comorbidity 2    Comorbidities cardiac, hx of hip fx    Examination-Activity Limitations Bend;Locomotion Level;Transfers;Stand;Stairs;Squat;Sleep    Examination-Participation Restrictions Community Activity;Cleaning;Laundry;Meal Prep;Yard Work;Other    Stability/Clinical Decision Making Evolving/Moderate complexity    Rehab Potential Good    PT Frequency 2x / week    PT Duration 6 weeks    PT Treatment/Interventions ADLs/Self Care Home Management;Cryotherapy;Electrical Stimulation;Gait training;Stair training;Functional mobility training;Therapeutic  activities;Therapeutic exercise;Balance training;Neuromuscular re-education;Patient/family education;Manual techniques;Passive range of motion;Dry needling;Energy conservation;Vestibular    PT Next Visit Plan Lower Extremety strengthening, Balance testing,  6 MWT, begin stair training    PT Home Exercise Plan 5x STS, 3x/day from kitchen chair    Consulted and Agree with Plan of Care Patient;Family member/caregiver           Patient will benefit from skilled therapeutic intervention in order to improve the following deficits and impairments:  Abnormal gait,Decreased endurance,Decreased balance,Decreased mobility,Difficulty walking,Decreased activity tolerance,Decreased strength,Cardiopulmonary status limiting activity,Decreased cognition,Decreased range of motion,Dizziness,Postural dysfunction,Decreased safety awareness  Visit Diagnosis: Muscle weakness (generalized)  Difficulty in walking, not elsewhere classified  Unsteadiness on feet  Other abnormalities of gait and mobility     Problem List Patient Active Problem List   Diagnosis Date Noted  . Carotid stenosis, asymptomatic, left 05/02/2018  . Carotid stenosis 03/08/2018  . Essential hypertension 03/08/2018  . CAD (coronary artery disease) 03/08/2018  . Hyperlipidemia 03/08/2018    Cindy Mcdonald 11/24/2020, 11:31 AM Cindy Mcdonald SPT  [place] [address], [place], [address] Phone: [phone]   Fax:  [phone]  Name: Cindy Mcdonald MRN: [id number] Date of Birth: 26-Nov-1929

## 2020-11-26 ENCOUNTER — Other Ambulatory Visit: Payer: Self-pay

## 2020-11-26 ENCOUNTER — Ambulatory Visit: Payer: Medicare HMO

## 2020-11-26 DIAGNOSIS — R2681 Unsteadiness on feet: Secondary | ICD-10-CM

## 2020-11-26 DIAGNOSIS — M6281 Muscle weakness (generalized): Secondary | ICD-10-CM | POA: Diagnosis not present

## 2020-11-26 DIAGNOSIS — R262 Difficulty in walking, not elsewhere classified: Secondary | ICD-10-CM

## 2020-11-26 NOTE — Therapy (Signed)
[place] [facility] [address] [place], [address] Phone: [phone]   Fax:  [phone]  Physical Therapy Treatment  Patient Details  Name: Cindy Mcdonald MRN: [id number] Date of Birth: 09-May-1930 No data recorded  Encounter Date: 11/26/2020   PT End of Session - 11/26/20 0955    Visit Number 8    Number of Visits 12    Date for PT Re-Evaluation 12/07/20    Authorization Type Medicare    Authorization - Visit Number 2    Authorization - Number of Visits 10    Progress Note Due on Visit 10    PT Start Time 0945    PT Stop Time 1030    PT Time Calculation (min) 45 min    Equipment Utilized During Treatment Gait belt    Activity Tolerance Patient tolerated treatment well;Patient limited by fatigue           Past Medical History:  Diagnosis Date  . Arthritis   . Carotid artery occlusion   . Carotid artery occlusion   . Complication of anesthesia    difficulty waking up for 3 days after back surgery  . Hyperlipidemia   . Hypertension     Past Surgical History:  Procedure Laterality Date  . BACK SURGERY    . CAROTID STENT    . CATARACT EXTRACTION, BILATERAL Bilateral   . ENDARTERECTOMY Left 05/02/2018   Procedure: ENDARTERECTOMY CAROTID;  Surgeon: Renford Dills, MD;  Location: [facility] OR[facility];  Service: Vascular;  Laterality: Left;  . JOINT REPLACEMENT    . REPLACEMENT TOTAL HIP W/  RESURFACING IMPLANTS     times two  . TONSILLECTOMY    . TOTAL SHOULDER REPLACEMENT Right     There were no vitals filed for this visit.   Subjective Assessment - 11/26/20 0949    Subjective Patient states no major changes since the previous session.    Patient is accompained by: Family member    Pertinent History per pt's daughter- pt has memory issues, she has fallen 3x in the past year, 1x she could not get up on her own.    Limitations Standing;Walking;Other (comment)   getting up from chair   How long can you  walk comfortably? 10-15 ft from recliner to pantry    Patient Stated Goals Pt would like to be able to get up from her chair more easily; also to be able to walk without falling, going up/down stairs to get in her home and to take her dog outside.    Currently in Pain? No/denies             Therapeutic Exercise    Sit to stand without UE support- 2x5- Foam+pillow Cone taps with LE- 10x2taps Hip abduction, flexion, extension in standing with B UE support from therapist - x 10 B cycle Walking without cane- 343ft- walking fast on straights, obstacles to navigate around [place] kicks in stand -- ~3 min x 10 B  Step ups onto 4" step - x 5 B Side stepping up and over 4" step - 2 x 3   Therex done for LE strength and balance        PT Education - 11/26/20 0954    Education Details form/technique with exercise; sit to stand steps to perform    Person(s) Educated Patient    Methods Explanation;Demonstration    Comprehension Verbalized understanding;Returned demonstration            PT  Short Term Goals - 10/26/20 1615      PT SHORT TERM GOAL #1   Title Perform 5xSTS test without use of UE    Baseline Unable to complete test without use of UE assistance    Time 3    Period Weeks    Status New    Target Date 11/16/20      PT SHORT TERM GOAL #2   Title Ambulate up and down 4-5 steps with use of single railing to get in and out of house independently    Baseline Unable    Time 3    Period Weeks    Status New    Target Date 11/16/20             PT Long Term Goals - 10/29/20 1540      PT LONG TERM GOAL #1   Title Improve 5xSTS time to 24 sec without use of UE from a standard chair to decrease risk of falls    Baseline 10/26/20; 44sec (22in seat, use of UE) <14.8s is indicative of decreased fall risk in this demographic    Time 6    Period Weeks    Status New      PT LONG TERM GOAL #2   Title Improve TUG time to 20 sec to reduce risk of falls    Baseline 10/26/20; 28sec  (use of cane) <20s is appropritae for community dwelling older adult    Time 6    Period Weeks    Status New      PT LONG TERM GOAL #3   Title Increase gait speed during 27m walk test to .68m/s to decrease risk of falls    Baseline 10/26/20; .76m/s (14s for test)    Time 6    Period Weeks    Status New      PT LONG TERM GOAL #4   Title Increase hip abductor strength to 4/5 strength of greater to decrease fall risk and improve performance in STS independence    Baseline 10/26/20; 3+/5 (sitting EOB)    Time 6    Period Weeks    Status New      PT LONG TERM GOAL #5   Title Improve distance to 98ft to show improvment in LE endurance and reduce fall risk    Baseline 10/29/20- 741ft    Time 6    Period Weeks    Status New    Target Date 12/07/20                 Plan - 11/26/20 1556    Clinical Impression Statement Patient demonstrates improvement with exercises performed today. Able to perform greater amount of standing exercises with less overall apprehension and LOB with exercises performed. Although patient is improving, she continues to have increased difficulty with performing step ups and walking without an AD. Patient will benefit from further skilled therapy to return to prior level of function.    Personal Factors and Comorbidities Age;Fitness;Comorbidity 2    Comorbidities cardiac, hx of hip fx    Examination-Activity Limitations Bend;Locomotion Level;Transfers;Stand;Stairs;Squat;Sleep    Examination-Participation Restrictions Community Activity;Cleaning;Laundry;Meal Prep;Yard Work;Other    Stability/Clinical Decision Making Evolving/Moderate complexity    Rehab Potential Good    PT Frequency 2x / week    PT Duration 6 weeks    PT Treatment/Interventions ADLs/Self Care Home Management;Cryotherapy;Electrical Stimulation;Gait training;Stair training;Functional mobility training;Therapeutic activities;Therapeutic exercise;Balance training;Neuromuscular  re-education;Patient/family education;Manual techniques;Passive range of motion;Dry needling;Energy conservation;Vestibular    PT Next  Visit Plan Lower Extremety strengthening, Balance testing, 6 MWT, begin stair training    PT Home Exercise Plan 5x STS, 3x/day from kitchen chair    Consulted and Agree with Plan of Care Patient;Family member/caregiver           Patient will benefit from skilled therapeutic intervention in order to improve the following deficits and impairments:  Abnormal gait,Decreased endurance,Decreased balance,Decreased mobility,Difficulty walking,Decreased activity tolerance,Decreased strength,Cardiopulmonary status limiting activity,Decreased cognition,Decreased range of motion,Dizziness,Postural dysfunction,Decreased safety awareness  Visit Diagnosis: Muscle weakness (generalized)  Difficulty in walking, not elsewhere classified  Unsteadiness on feet     Problem List Patient Active Problem List   Diagnosis Date Noted  . Carotid stenosis, asymptomatic, left 05/02/2018  . Carotid stenosis 03/08/2018  . Essential hypertension 03/08/2018  . CAD (coronary artery disease) 03/08/2018  . Hyperlipidemia 03/08/2018    Myrene Galas, PT DPT 11/26/2020, 4:10 PM  [facility] [address] [place], [address] Phone: [phone]   Fax:  [phone]  Name: Cindy Mcdonald MRN: [id number] Date of Birth: 07/05/1930

## 2020-12-01 ENCOUNTER — Ambulatory Visit: Payer: Medicare HMO | Attending: Internal Medicine

## 2020-12-01 ENCOUNTER — Other Ambulatory Visit: Payer: Self-pay

## 2020-12-01 DIAGNOSIS — R262 Difficulty in walking, not elsewhere classified: Secondary | ICD-10-CM | POA: Diagnosis present

## 2020-12-01 DIAGNOSIS — R2681 Unsteadiness on feet: Secondary | ICD-10-CM | POA: Diagnosis present

## 2020-12-01 DIAGNOSIS — R2689 Other abnormalities of gait and mobility: Secondary | ICD-10-CM | POA: Diagnosis present

## 2020-12-01 DIAGNOSIS — M6281 Muscle weakness (generalized): Secondary | ICD-10-CM | POA: Insufficient documentation

## 2020-12-01 NOTE — Therapy (Signed)
[facility] [address], [place], [address] Phone: [phone]   Fax:  [phone]  Physical Therapy Treatment  Patient Details  Name: Cindy Mcdonald MRN: [id number] Date of Birth: Aug 18, 1930 No data recorded  Encounter Date: 12/01/2020   PT End of Session - 12/01/20 1126    Visit Number 9    Number of Visits 12    Date for PT Re-Evaluation 12/07/20    Authorization Type Medicare    Authorization - Visit Number 2    Authorization - Number of Visits 10    Progress Note Due on Visit 10    PT Start Time 1030    PT Stop Time 1115    PT Time Calculation (min) 45 min    Equipment Utilized During Treatment Gait belt    Activity Tolerance Patient tolerated treatment well;Patient limited by fatigue           Past Medical History:  Diagnosis Date  . Arthritis   . Carotid artery occlusion   . Carotid artery occlusion   . Complication of anesthesia    difficulty waking up for 3 days after back surgery  . Hyperlipidemia   . Hypertension     Past Surgical History:  Procedure Laterality Date  . BACK SURGERY    . CAROTID STENT    . CATARACT EXTRACTION, BILATERAL Bilateral   . ENDARTERECTOMY Left 05/02/2018   Procedure: ENDARTERECTOMY CAROTID;  Surgeon: Renford Dills, MD;  Location: [facility] ORS;  Service: Vascular;  Laterality: Left;  . JOINT REPLACEMENT    . REPLACEMENT TOTAL HIP W/  RESURFACING IMPLANTS     times two  . TONSILLECTOMY    . TOTAL SHOULDER REPLACEMENT Right     There were no vitals filed for this visit.   Subjective Assessment - 12/01/20 1035    Subjective Patient reports feeling a little dizzy today. She feels strong otherwise.    Patient is accompained by: Family member    Pertinent History per pt's daughter- pt has memory issues, she has fallen 3x in the past year, 1x she could not get up on her own.    Limitations Standing;Walking;Other (comment)   getting up from chair   How  long can you walk comfortably? 10-15 ft from recliner to pantry    Patient Stated Goals Pt would like to be able to get up from her chair more easily; also to be able to walk without falling, going up/down stairs to get in her home and to take her dog outside.             Therapeutic Exercise  Sit to Stand- 2x5 with airex on chair Ambulating w/o AD- 560ft, fast on straights Step up on 4in step- 2x5 each foot Cone taps --> hurdle walking- 2 cone taps, 3 hurdles x3 Walking over foam, over 3 hurdles, up stairs- x2  Therapeutic exercise to improve LE strength and balance     PT Education - 12/01/20 1122    Education Details form/technique with exercise    Person(s) Educated Patient    Methods Explanation;Demonstration    Comprehension Verbalized understanding;Returned demonstration            PT Short Term Goals - 10/26/20 1615      PT SHORT TERM GOAL #1   Title Perform 5xSTS test without use of UE    Baseline Unable to complete test without use of UE assistance    Time 3  Period Weeks    Status New    Target Date 11/16/20      PT SHORT TERM GOAL #2   Title Ambulate up and down 4-5 steps with use of single railing to get in and out of house independently    Baseline Unable    Time 3    Period Weeks    Status New    Target Date 11/16/20             PT Long Term Goals - 10/29/20 1540      PT LONG TERM GOAL #1   Title Improve 5xSTS time to 24 sec without use of UE from a standard chair to decrease risk of falls    Baseline 10/26/20; 44sec (22in seat, use of UE) <14.8s is indicative of decreased fall risk in this demographic    Time 6    Period Weeks    Status New      PT LONG TERM GOAL #2   Title Improve TUG time to 20 sec to reduce risk of falls    Baseline 10/26/20; 28sec (use of cane) <20s is appropritae for community dwelling older adult    Time 6    Period Weeks    Status New      PT LONG TERM GOAL #3   Title Increase gait speed during 65m walk  test to .77m/s to decrease risk of falls    Baseline 10/26/20; .81m/s (14s for test)    Time 6    Period Weeks    Status New      PT LONG TERM GOAL #4   Title Increase hip abductor strength to 4/5 strength of greater to decrease fall risk and improve performance in STS independence    Baseline 10/26/20; 3+/5 (sitting EOB)    Time 6    Period Weeks    Status New      PT LONG TERM GOAL #5   Title Improve distance to 938ft to show improvment in LE endurance and reduce fall risk    Baseline 10/29/20- 777ft    Time 6    Period Weeks    Status New    Target Date 12/07/20                 Plan - 12/01/20 1123    Clinical Impression Statement Patient came to clinic today with increased dizziness. Patient was able to perform exercises but needed increased rest time because of the dizziness. Patient showed improved performane today, being able to step over hurdles without assitance. Patient performs exercises better when moving quicker but needs cued to do so. Patient will benefit from further skilled therapy to reutrn to PLOF    Personal Factors and Comorbidities Age;Fitness;Comorbidity 2    Comorbidities cardiac, hx of hip fx    Examination-Activity Limitations Bend;Locomotion Level;Transfers;Stand;Stairs;Squat;Sleep    Examination-Participation Restrictions Community Activity;Cleaning;Laundry;Meal Prep;Yard Work;Other    Stability/Clinical Decision Making Evolving/Moderate complexity    Rehab Potential Good    PT Frequency 2x / week    PT Duration 6 weeks    PT Treatment/Interventions ADLs/Self Care Home Management;Cryotherapy;Electrical Stimulation;Gait training;Stair training;Functional mobility training;Therapeutic activities;Therapeutic exercise;Balance training;Neuromuscular re-education;Patient/family education;Manual techniques;Passive range of motion;Dry needling;Energy conservation;Vestibular    PT Next Visit Plan Lower Extremety strengthening, Balance testing, 6 MWT,  begin stair training    PT Home Exercise Plan 5x STS, 3x/day from kitchen chair    Consulted and Agree with Plan of Care Patient;Family member/caregiver  Patient will benefit from skilled therapeutic intervention in order to improve the following deficits and impairments:  Abnormal gait,Decreased endurance,Decreased balance,Decreased mobility,Difficulty walking,Decreased activity tolerance,Decreased strength,Cardiopulmonary status limiting activity,Decreased cognition,Decreased range of motion,Dizziness,Postural dysfunction,Decreased safety awareness  Visit Diagnosis: Muscle weakness (generalized)  Difficulty in walking, not elsewhere classified  Unsteadiness on feet  Other abnormalities of gait and mobility     Problem List Patient Active Problem List   Diagnosis Date Noted  . Carotid stenosis, asymptomatic, left 05/02/2018  . Carotid stenosis 03/08/2018  . Essential hypertension 03/08/2018  . CAD (coronary artery disease) 03/08/2018  . Hyperlipidemia 03/08/2018    Silvano Rusk 12/01/2020, 11:27 AM Silvano Rusk SPT  [facility] PH[facility] SPORTS MEDICINE [address], [place], [address] Phone: [phone]   Fax:  [phone]  Name: CARLIYAH COTTERMAN MRN: [id number] Date of Birth: 1929/10/24

## 2020-12-07 ENCOUNTER — Ambulatory Visit: Payer: Medicare HMO

## 2020-12-07 ENCOUNTER — Other Ambulatory Visit: Payer: Self-pay

## 2020-12-07 DIAGNOSIS — R2681 Unsteadiness on feet: Secondary | ICD-10-CM

## 2020-12-07 DIAGNOSIS — M6281 Muscle weakness (generalized): Secondary | ICD-10-CM | POA: Diagnosis not present

## 2020-12-07 DIAGNOSIS — R262 Difficulty in walking, not elsewhere classified: Secondary | ICD-10-CM

## 2020-12-08 NOTE — Therapy (Signed)
[facility] [address], [place], [address] Phone: [phone]   Fax:  [phone]  Physical Therapy Treatment  Patient Details  Name: Cindy Mcdonald MRN: [id number] Date of Birth: 1929/12/11 No data recorded  Encounter Date: 12/07/2020   PT End of Session - 12/07/20 1354    Visit Number 10    Number of Visits 12    Date for PT Re-Evaluation 12/07/20    Authorization Type Medicare    Authorization - Visit Number 2    Authorization - Number of Visits 10    Progress Note Due on Visit 10    PT Start Time 1345    PT Stop Time 1430    PT Time Calculation (min) 45 min    Equipment Utilized During Treatment Gait belt    Activity Tolerance Patient tolerated treatment well;Patient limited by fatigue           Past Medical History:  Diagnosis Date  . Arthritis   . Carotid artery occlusion   . Carotid artery occlusion   . Complication of anesthesia    difficulty waking up for 3 days after back surgery  . Hyperlipidemia   . Hypertension     Past Surgical History:  Procedure Laterality Date  . BACK SURGERY    . CAROTID STENT    . CATARACT EXTRACTION, BILATERAL Bilateral   . ENDARTERECTOMY Left 05/02/2018   Procedure: ENDARTERECTOMY CAROTID;  Surgeon: Renford Dills, MD;  Location: [facility] ORS;  Service: Vascular;  Laterality: Left;  . JOINT REPLACEMENT    . REPLACEMENT TOTAL HIP W/  RESURFACING IMPLANTS     times two  . TONSILLECTOMY    . TOTAL SHOULDER REPLACEMENT Right     There were no vitals filed for this visit.   Subjective Assessment - 12/07/20 1351    Subjective Patient states she has been doing her sit to stand exercises before eating. Patient states she has not been practicing doing the exercises fast.    Patient is accompained by: Family member    Pertinent History per pt's daughter- pt has memory issues, she has fallen 3x in the past year, 1x she could not get up on her own.     Limitations Standing;Walking;Other (comment)   getting up from chair   How long can you walk comfortably? 10-15 ft from recliner to pantry    Patient Stated Goals Pt would like to be able to get up from her chair more easily; also to be able to walk without falling, going up/down stairs to get in her home and to take her dog outside.    Currently in Pain? No/denies                 Therapeutic Exercise   Sit to Stand- 2x5 with airex on chair Ambulation with focus on improving speed and decreasing time - x 548ft  Step ups onto 4" step - x 5 B  Sit to stand and walking 10 ft- 2 x 10  Walking with focus on improving speed - 2 x 10  Educated on when to perform tasks throughout the day  Therapeutic exercise to improve LE strength and balance     PT Education - 12/07/20 1354    Education Details form/technique with exercise    Person(s) Educated Patient    Methods Explanation;Demonstration    Comprehension Verbalized understanding;Returned demonstration            PT Short Term  Goals - 12/07/20 1413      PT SHORT TERM GOAL #1   Title Perform 5xSTS test without use of UE    Baseline Unable to complete test without use of UE assistance: 12/07/2020: able to complete with increased height    Time 3    Period Weeks    Status On-going    Target Date 11/16/20      PT SHORT TERM GOAL #2   Title Ambulate up and down 4-5 steps with use of single railing to get in and out of house independently    Baseline Unable; 12/07/2020: able to perform with single UE    Time 3    Period Weeks    Status Achieved    Target Date 11/16/20             PT Long Term Goals - 12/07/20 1400      PT LONG TERM GOAL #1   Title Improve 5xSTS time to 24 sec without use of UE from a standard chair to decrease risk of falls    Baseline 10/26/20; 44sec (22in seat, use of UE) <14.8s is indicative of decreased fall risk in this demographic; 12/07/2020 20" 38 sec with use of UE    Time 6    Period Weeks     Status On-going      PT LONG TERM GOAL #2   Title Improve TUG time to 20 sec to reduce risk of falls    Baseline 10/26/20; 28sec (use of cane) <20s is appropritae for community dwelling older adult; 12/08/2019: 21 sec    Time 6    Period Weeks    Status On-going      PT LONG TERM GOAL #3   Title Increase gait speed during 50m walk test to .66m/s to decrease risk of falls    Baseline 10/26/20; .73m/s (14s for test); 12/07/2020: .8 m/s    Time 6    Period Weeks    Status On-going      PT LONG TERM GOAL #4   Title Increase hip abductor strength to 4/5 strength of greater to decrease fall risk and improve performance in STS independence    Baseline 10/26/20; 3+/5 (sitting EOB); 12/07/2020: 4/5 at EOB    Time 6    Period Weeks    Status Achieved      PT LONG TERM GOAL #5   Title Improve distance to 948ft to show improvment in LE endurance and reduce fall risk    Baseline 10/29/20- 767ft; 12/07/2020: 526ft    Time 6    Period Weeks    Status On-going                 Plan - 12/07/20 1606    Clinical Impression Statement Recertification of goals performed today. Patient with improvement in 5xsts, , and TUG indicating improvement in gait and dynamic strengthening. Although patient is improving, she had a decrease in 6 min wt having to stop at 4 minutes 22sec and only achieving 500 ft vs 714ft. This is most likely due to inactivity outside of the session. Will continue to focus on improving these limitations thoruhgout further sessions. Patient will benefit from further skilled therapy to improve functional capacity.    Personal Factors and Comorbidities Age;Fitness;Comorbidity 2    Comorbidities cardiac, hx of hip fx    Examination-Activity Limitations Bend;Locomotion Level;Transfers;Stand;Stairs;Squat;Sleep    Examination-Participation Restrictions Community Activity;Cleaning;Laundry;Meal Prep;Yard Work;Other    Stability/Clinical Decision Making Evolving/Moderate complexity  Rehab Potential Good    PT Frequency 2x / week    PT Duration 6 weeks    PT Treatment/Interventions ADLs/Self Care Home Management;Cryotherapy;Electrical Stimulation;Gait training;Stair training;Functional mobility training;Therapeutic activities;Therapeutic exercise;Balance training;Neuromuscular re-education;Patient/family education;Manual techniques;Passive range of motion;Dry needling;Energy conservation;Vestibular    PT Next Visit Plan Lower Extremety strengthening, Balance testing, 6 MWT, begin stair training    PT Home Exercise Plan 5x STS, 3x/day from kitchen chair    Consulted and Agree with Plan of Care Patient;Family member/caregiver           Patient will benefit from skilled therapeutic intervention in order to improve the following deficits and impairments:  Abnormal gait,Decreased endurance,Decreased balance,Decreased mobility,Difficulty walking,Decreased activity tolerance,Decreased strength,Cardiopulmonary status limiting activity,Decreased cognition,Decreased range of motion,Dizziness,Postural dysfunction,Decreased safety awareness  Visit Diagnosis: Muscle weakness (generalized)  Difficulty in walking, not elsewhere classified  Unsteadiness on feet     Problem List Patient Active Problem List   Diagnosis Date Noted  . Carotid stenosis, asymptomatic, left 05/02/2018  . Carotid stenosis 03/08/2018  . Essential hypertension 03/08/2018  . CAD (coronary artery disease) 03/08/2018  . Hyperlipidemia 03/08/2018    Myrene Galas, PT DPT 12/08/2020, 7:49 AM  [facility] PHYSICAL AND SPORTS MEDICINE [address] [place], [address] Phone: [phone]   Fax:  [phone]  Name: Cindy Mcdonald MRN: [id number] Date of Birth: 1930/06/13

## 2020-12-10 ENCOUNTER — Other Ambulatory Visit: Payer: Self-pay

## 2020-12-10 ENCOUNTER — Ambulatory Visit: Payer: Medicare HMO

## 2020-12-10 DIAGNOSIS — M6281 Muscle weakness (generalized): Secondary | ICD-10-CM

## 2020-12-10 DIAGNOSIS — R2681 Unsteadiness on feet: Secondary | ICD-10-CM

## 2020-12-10 DIAGNOSIS — R262 Difficulty in walking, not elsewhere classified: Secondary | ICD-10-CM

## 2020-12-10 NOTE — Therapy (Signed)
[facility] [address], [place], [address] Phone: [phone]   Fax:  [phone]  Physical Therapy Treatment  Patient Details  Name: Cindy Mcdonald MRN: [id number] Date of Birth: 12-12-1929 No data recorded  Encounter Date: 12/10/2020   PT End of Session - 12/10/20 1150    Visit Number 11    Number of Visits 12    Date for PT Re-Evaluation 12/07/20    Authorization Type Medicare    Authorization - Visit Number 2    Authorization - Number of Visits 10    Progress Note Due on Visit 10    PT Start Time 1115    PT Stop Time 1200    PT Time Calculation (min) 45 min    Equipment Utilized During Treatment Gait belt    Activity Tolerance Patient tolerated treatment well;Patient limited by fatigue    Behavior During Therapy WFL for tasks assessed/performed           Past Medical History:  Diagnosis Date  . Arthritis   . Carotid artery occlusion   . Carotid artery occlusion   . Complication of anesthesia    difficulty waking up for 3 days after back surgery  . Hyperlipidemia   . Hypertension     Past Surgical History:  Procedure Laterality Date  . BACK SURGERY    . CAROTID STENT    . CATARACT EXTRACTION, BILATERAL Bilateral   . ENDARTERECTOMY Left 05/02/2018   Procedure: ENDARTERECTOMY CAROTID;  Surgeon: Renford Dills, MD;  Location: [facility] ORS;  Service: Vascular;  Laterality: Left;  . JOINT REPLACEMENT    . REPLACEMENT TOTAL HIP W/  RESURFACING IMPLANTS     times two  . TONSILLECTOMY    . TOTAL SHOULDER REPLACEMENT Right     There were no vitals filed for this visit.   Subjective Assessment - 12/10/20 1129    Subjective Patient reports she does not remember if she practiced her sit to stand exercises that was educated on during the previous session.    Patient is accompained by: Family member    Pertinent History per pt's daughter- pt has memory issues, she has fallen 3x in the past  year, 1x she could not get up on her own.    Limitations Standing;Walking;Other (comment)   getting up from chair   How long can you walk comfortably? 10-15 ft from recliner to pantry    Patient Stated Goals Pt would like to be able to get up from her chair more easily; also to be able to walk without falling, going up/down stairs to get in her home and to take her dog outside.    Currently in Pain? No/denies              Therapeutic Exercise   Sit to Stand- 2x6 with airex on chair Ambulation with focus on improving speed and decreasing time - x 447ft  Stair climbing with B UE support - x 2 (4 steps to complete) Step ups onto 4" step -  2x 5 B  Side stepping across airex beam - 4 x 5 ft Backwards amb with focus on taking large steps; forward amb with focus   Educated on when to perform tasks throughout the day   Therapeutic exercise to improve LE strength and balance   PT Education - 12/10/20 1149    Education Details form/technique with exercise    Person(s) Educated Patient    Methods Explanation;Demonstration  Comprehension Verbalized understanding;Returned demonstration            PT Short Term Goals - 12/07/20 1413      PT SHORT TERM GOAL #1   Title Perform 5xSTS test without use of UE    Baseline Unable to complete test without use of UE assistance: 12/07/2020: able to complete with increased height    Time 3    Period Weeks    Status On-going    Target Date 11/16/20      PT SHORT TERM GOAL #2   Title Ambulate up and down 4-5 steps with use of single railing to get in and out of house independently    Baseline Unable; 12/07/2020: able to perform with single UE    Time 3    Period Weeks    Status Achieved    Target Date 11/16/20             PT Long Term Goals - 12/07/20 1400      PT LONG TERM GOAL #1   Title Improve 5xSTS time to 24 sec without use of UE from a standard chair to decrease risk of falls    Baseline 10/26/20; 44sec (22in seat, use of UE)  <14.8s is indicative of decreased fall risk in this demographic; 12/07/2020 20" 38 sec with use of UE    Time 6    Period Weeks    Status On-going      PT LONG TERM GOAL #2   Title Improve TUG time to 20 sec to reduce risk of falls    Baseline 10/26/20; 28sec (use of cane) <20s is appropritae for community dwelling older adult; 12/08/2019: 21 sec    Time 6    Period Weeks    Status On-going      PT LONG TERM GOAL #3   Title Increase gait speed during 3m walk test to .57m/s to decrease risk of falls    Baseline 10/26/20; .93m/s (14s for test); 12/07/2020: .8 m/s    Time 6    Period Weeks    Status On-going      PT LONG TERM GOAL #4   Title Increase hip abductor strength to 4/5 strength of greater to decrease fall risk and improve performance in STS independence    Baseline 10/26/20; 3+/5 (sitting EOB); 12/07/2020: 4/5 at EOB    Time 6    Period Weeks    Status Achieved      PT LONG TERM GOAL #5   Title Improve distance to 935ft to show improvment in LE endurance and reduce fall risk    Baseline 10/29/20- 773ft; 12/07/2020: 580ft    Time 6    Period Weeks    Status On-going                 Plan - 12/10/20 1327    Clinical Impression Statement Patient demonstrates improvement with stepping exercises today with ability to perform wider steps without increase in imbalance. Patient also able to perform step ups without UE support to a greater degree. Patient does have poor carryover between sessions, this is most likely due cognitive defeicts. Patient will benefit from further skilled therapy to return to prior level of function.    Personal Factors and Comorbidities Age;Fitness;Comorbidity 2    Comorbidities cardiac, hx of hip fx    Examination-Activity Limitations Bend;Locomotion Level;Transfers;Stand;Stairs;Squat;Sleep    Examination-Participation Restrictions Community Activity;Cleaning;Laundry;Meal Prep;Yard Work;Other    Stability/Clinical Decision Making Evolving/Moderate  complexity    Rehab Potential  Good    PT Frequency 2x / week    PT Duration 6 weeks    PT Treatment/Interventions ADLs/Self Care Home Management;Cryotherapy;Electrical Stimulation;Gait training;Stair training;Functional mobility training;Therapeutic activities;Therapeutic exercise;Balance training;Neuromuscular re-education;Patient/family education;Manual techniques;Passive range of motion;Dry needling;Energy conservation;Vestibular    PT Next Visit Plan Lower Extremety strengthening, Balance testing, 6 MWT, begin stair training    PT Home Exercise Plan 5x STS, 3x/day from kitchen chair    Consulted and Agree with Plan of Care Patient;Family member/caregiver           Patient will benefit from skilled therapeutic intervention in order to improve the following deficits and impairments:  Abnormal gait,Decreased endurance,Decreased balance,Decreased mobility,Difficulty walking,Decreased activity tolerance,Decreased strength,Cardiopulmonary status limiting activity,Decreased cognition,Decreased range of motion,Dizziness,Postural dysfunction,Decreased safety awareness  Visit Diagnosis: Muscle weakness (generalized)  Difficulty in walking, not elsewhere classified  Unsteadiness on feet     Problem List Patient Active Problem List   Diagnosis Date Noted  . Carotid stenosis, asymptomatic, left 05/02/2018  . Carotid stenosis 03/08/2018  . Essential hypertension 03/08/2018  . CAD (coronary artery disease) 03/08/2018  . Hyperlipidemia 03/08/2018    Myrene Galas, PT DPT 12/10/2020, 1:32 PM  [facility] PHYSICAL AND SPORTS MEDICINE [address] [place], [address] Phone: [phone]   Fax:  [phone]  Name: Cindy Mcdonald MRN: [id number] Date of Birth: 10/31/1929

## 2020-12-13 ENCOUNTER — Encounter: Payer: Self-pay | Admitting: Emergency Medicine

## 2020-12-13 ENCOUNTER — Inpatient Hospital Stay
Admission: EM | Admit: 2020-12-13 | Discharge: 2020-12-17 | DRG: 535 | Disposition: A | Discharge status: Skilled Nursing Facility | Payer: Medicare HMO | Attending: Internal Medicine | Admitting: Internal Medicine

## 2020-12-13 ENCOUNTER — Observation Stay: Payer: Medicare HMO

## 2020-12-13 ENCOUNTER — Emergency Department: Payer: Medicare HMO

## 2020-12-13 ENCOUNTER — Other Ambulatory Visit: Payer: Self-pay

## 2020-12-13 DIAGNOSIS — J329 Chronic sinusitis, unspecified: Secondary | ICD-10-CM | POA: Diagnosis present

## 2020-12-13 DIAGNOSIS — F0390 Unspecified dementia without behavioral disturbance: Secondary | ICD-10-CM | POA: Diagnosis present

## 2020-12-13 DIAGNOSIS — R339 Retention of urine, unspecified: Secondary | ICD-10-CM | POA: Diagnosis present

## 2020-12-13 DIAGNOSIS — Z7982 Long term (current) use of aspirin: Secondary | ICD-10-CM

## 2020-12-13 DIAGNOSIS — I10 Essential (primary) hypertension: Secondary | ICD-10-CM | POA: Diagnosis present

## 2020-12-13 DIAGNOSIS — R059 Cough, unspecified: Secondary | ICD-10-CM | POA: Diagnosis present

## 2020-12-13 DIAGNOSIS — S72001A Fracture of unspecified part of neck of right femur, initial encounter for closed fracture: Secondary | ICD-10-CM | POA: Diagnosis present

## 2020-12-13 DIAGNOSIS — Z20822 Contact with and (suspected) exposure to covid-19: Secondary | ICD-10-CM | POA: Diagnosis present

## 2020-12-13 DIAGNOSIS — R338 Other retention of urine: Secondary | ICD-10-CM

## 2020-12-13 DIAGNOSIS — Z66 Do not resuscitate: Secondary | ICD-10-CM | POA: Diagnosis present

## 2020-12-13 DIAGNOSIS — Z96641 Presence of right artificial hip joint: Secondary | ICD-10-CM | POA: Diagnosis present

## 2020-12-13 DIAGNOSIS — D649 Anemia, unspecified: Secondary | ICD-10-CM | POA: Diagnosis present

## 2020-12-13 DIAGNOSIS — S72114A Nondisplaced fracture of greater trochanter of right femur, initial encounter for closed fracture: Principal | ICD-10-CM | POA: Diagnosis present

## 2020-12-13 DIAGNOSIS — E782 Mixed hyperlipidemia: Secondary | ICD-10-CM

## 2020-12-13 DIAGNOSIS — N814 Uterovaginal prolapse, unspecified: Secondary | ICD-10-CM | POA: Diagnosis present

## 2020-12-13 DIAGNOSIS — E43 Unspecified severe protein-calorie malnutrition: Secondary | ICD-10-CM | POA: Diagnosis present

## 2020-12-13 DIAGNOSIS — I959 Hypotension, unspecified: Secondary | ICD-10-CM | POA: Diagnosis present

## 2020-12-13 DIAGNOSIS — S72009A Fracture of unspecified part of neck of unspecified femur, initial encounter for closed fracture: Secondary | ICD-10-CM | POA: Diagnosis not present

## 2020-12-13 DIAGNOSIS — W010XXA Fall on same level from slipping, tripping and stumbling without subsequent striking against object, initial encounter: Secondary | ICD-10-CM | POA: Diagnosis present

## 2020-12-13 DIAGNOSIS — I2510 Atherosclerotic heart disease of native coronary artery without angina pectoris: Secondary | ICD-10-CM | POA: Diagnosis present

## 2020-12-13 DIAGNOSIS — E46 Unspecified protein-calorie malnutrition: Secondary | ICD-10-CM | POA: Diagnosis present

## 2020-12-13 DIAGNOSIS — I6522 Occlusion and stenosis of left carotid artery: Secondary | ICD-10-CM | POA: Diagnosis present

## 2020-12-13 DIAGNOSIS — Z96611 Presence of right artificial shoulder joint: Secondary | ICD-10-CM | POA: Diagnosis present

## 2020-12-13 DIAGNOSIS — Z79899 Other long term (current) drug therapy: Secondary | ICD-10-CM

## 2020-12-13 DIAGNOSIS — H409 Unspecified glaucoma: Secondary | ICD-10-CM | POA: Diagnosis present

## 2020-12-13 DIAGNOSIS — E785 Hyperlipidemia, unspecified: Secondary | ICD-10-CM | POA: Diagnosis present

## 2020-12-13 DIAGNOSIS — S40022A Contusion of left upper arm, initial encounter: Secondary | ICD-10-CM | POA: Diagnosis present

## 2020-12-13 DIAGNOSIS — S0083XA Contusion of other part of head, initial encounter: Secondary | ICD-10-CM | POA: Diagnosis present

## 2020-12-13 DIAGNOSIS — Z885 Allergy status to narcotic agent status: Secondary | ICD-10-CM

## 2020-12-13 DIAGNOSIS — F17210 Nicotine dependence, cigarettes, uncomplicated: Secondary | ICD-10-CM | POA: Diagnosis present

## 2020-12-13 DIAGNOSIS — Z96651 Presence of right artificial knee joint: Secondary | ICD-10-CM | POA: Diagnosis present

## 2020-12-13 DIAGNOSIS — Z886 Allergy status to analgesic agent status: Secondary | ICD-10-CM

## 2020-12-13 DIAGNOSIS — W19XXXA Unspecified fall, initial encounter: Secondary | ICD-10-CM

## 2020-12-13 DIAGNOSIS — R5383 Other fatigue: Secondary | ICD-10-CM

## 2020-12-13 DIAGNOSIS — E871 Hypo-osmolality and hyponatremia: Secondary | ICD-10-CM | POA: Diagnosis present

## 2020-12-13 DIAGNOSIS — S40021A Contusion of right upper arm, initial encounter: Secondary | ICD-10-CM | POA: Diagnosis present

## 2020-12-13 DIAGNOSIS — Z8249 Family history of ischemic heart disease and other diseases of the circulatory system: Secondary | ICD-10-CM

## 2020-12-13 LAB — CBC WITH DIFFERENTIAL/PLATELET
Abs Immature Granulocytes: 0.12 10*3/uL — ABNORMAL HIGH (ref 0.00–0.07)
Basophils Absolute: 0 10*3/uL (ref 0.0–0.1)
Basophils Relative: 0 %
Eosinophils Absolute: 0 10*3/uL (ref 0.0–0.5)
Eosinophils Relative: 0 %
HCT: 32.4 % — ABNORMAL LOW (ref 36.0–46.0)
Hemoglobin: 10.6 g/dL — ABNORMAL LOW (ref 12.0–15.0)
Immature Granulocytes: 1 %
Lymphocytes Relative: 8 %
Lymphs Abs: 1.2 10*3/uL (ref 0.7–4.0)
MCH: 29 pg (ref 26.0–34.0)
MCHC: 32.7 g/dL (ref 30.0–36.0)
MCV: 88.5 fL (ref 80.0–100.0)
Monocytes Absolute: 0.9 10*3/uL (ref 0.1–1.0)
Monocytes Relative: 6 %
Neutro Abs: 12.8 10*3/uL — ABNORMAL HIGH (ref 1.7–7.7)
Neutrophils Relative %: 85 %
Platelets: 279 10*3/uL (ref 150–400)
RBC: 3.66 MIL/uL — ABNORMAL LOW (ref 3.87–5.11)
RDW: 14.1 % (ref 11.5–15.5)
WBC: 15 10*3/uL — ABNORMAL HIGH (ref 4.0–10.5)
nRBC: 0 % (ref 0.0–0.2)

## 2020-12-13 LAB — COMPREHENSIVE METABOLIC PANEL
ALT: 15 U/L (ref 0–44)
AST: 30 U/L (ref 15–41)
Albumin: 3.5 g/dL (ref 3.5–5.0)
Alkaline Phosphatase: 60 U/L (ref 38–126)
Anion gap: 9 (ref 5–15)
BUN: 30 mg/dL — ABNORMAL HIGH (ref 8–23)
CO2: 25 mmol/L (ref 22–32)
Calcium: 9.1 mg/dL (ref 8.9–10.3)
Chloride: 98 mmol/L (ref 98–111)
Creatinine, Ser: 1.03 mg/dL — ABNORMAL HIGH (ref 0.44–1.00)
GFR, Estimated: 52 mL/min — ABNORMAL LOW (ref >60)
Glucose, Bld: 147 mg/dL — ABNORMAL HIGH (ref 70–99)
Potassium: 4.3 mmol/L (ref 3.5–5.1)
Sodium: 132 mmol/L — ABNORMAL LOW (ref 135–145)
Total Bilirubin: 0.7 mg/dL (ref 0.3–1.2)
Total Protein: 6.6 g/dL (ref 6.5–8.1)

## 2020-12-13 LAB — CK: Total CK: 184 U/L (ref 38–234)

## 2020-12-13 LAB — TROPONIN I (HIGH SENSITIVITY): Troponin I (High Sensitivity): 18 ng/L — ABNORMAL HIGH (ref <18)

## 2020-12-13 IMAGING — DX DG CHEST 1V PORT
1 series · 1 of 1 positions shown · non-contrast
Comparison: [DATE]

CLINICAL DATA: Fall

EXAM:
PORTABLE CHEST 1 VIEW

[chest ap]
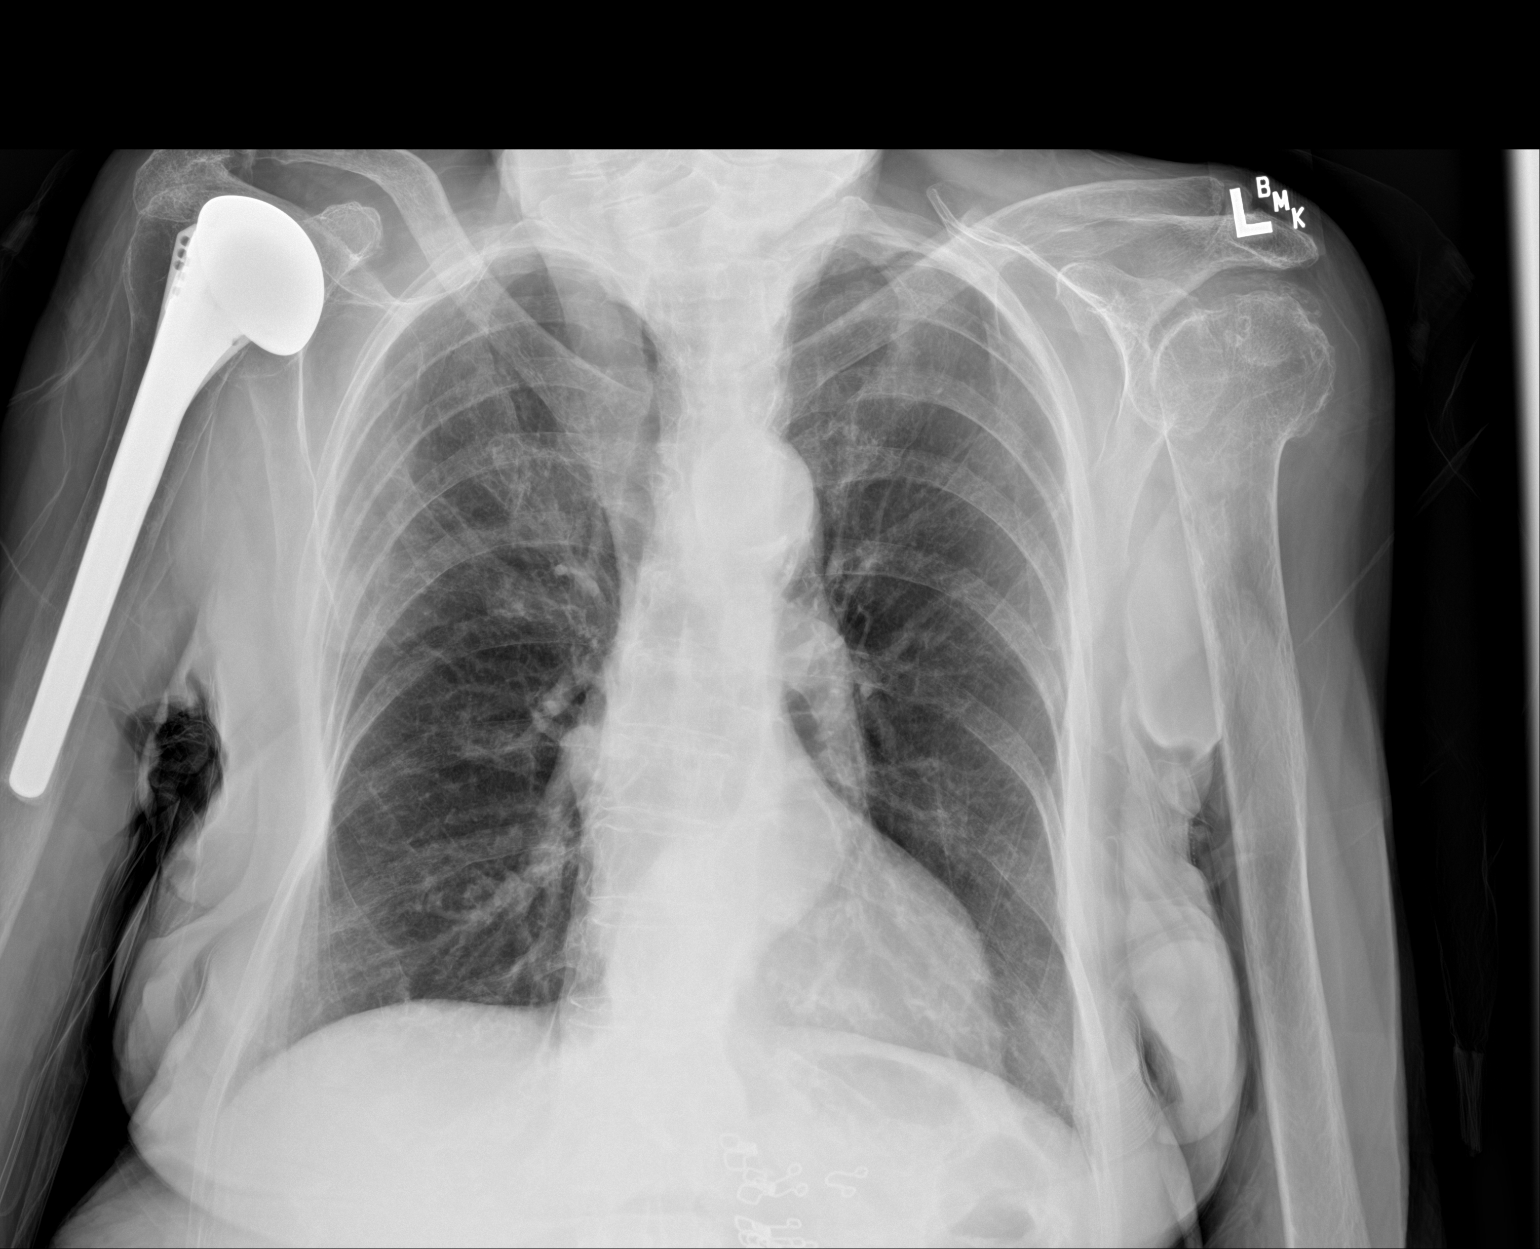

[1 of 1 positions shown; findings below may reference images not displayed]

FINDINGS: The heart size and mediastinal contours are within normal limits.
Both lungs are clear. The visualized skeletal structures are
unremarkable.
IMPRESSION: No active disease.

## 2020-12-13 MED ORDER — LOSARTAN POTASSIUM 50 MG PO TABS
25.0000 mg | ORAL_TABLET | Freq: Every day | ORAL | Status: DC
  Administered 2020-12-14 – 2020-12-15 (×2): 25 mg via ORAL
  Filled 2020-12-13 (×2): qty 1

## 2020-12-13 MED ORDER — ONDANSETRON HCL 4 MG/2ML IJ SOLN: 4.0000 mg | Freq: Four times a day (QID) | INTRAMUSCULAR | Status: AC | PRN

## 2020-12-13 MED ORDER — LATANOPROST 0.005 % OP SOLN
1.0000 [drp] | Freq: Every day | OPHTHALMIC | Status: DC
  Administered 2020-12-13 – 2020-12-16 (×3): 1 [drp] via OPHTHALMIC
  Filled 2020-12-13: qty 2.5

## 2020-12-13 MED ORDER — AMLODIPINE BESYLATE 5 MG PO TABS
5.0000 mg | ORAL_TABLET | Freq: Every day | ORAL | Status: DC
  Administered 2020-12-14 – 2020-12-15 (×2): 5 mg via ORAL
  Filled 2020-12-13 (×2): qty 1

## 2020-12-13 MED ORDER — DORZOLAMIDE HCL-TIMOLOL MAL 2-0.5 % OP SOLN
1.0000 [drp] | Freq: Two times a day (BID) | OPHTHALMIC | Status: DC
  Administered 2020-12-13 – 2020-12-17 (×7): 1 [drp] via OPHTHALMIC
  Filled 2020-12-13: qty 10

## 2020-12-13 MED ORDER — MORPHINE SULFATE (PF) 2 MG/ML IV SOLN: 0.5000 mg | INTRAVENOUS | Status: DC | PRN

## 2020-12-13 MED ORDER — MEMANTINE HCL 5 MG PO TABS
10.0000 mg | ORAL_TABLET | Freq: Two times a day (BID) | ORAL | Status: DC
  Administered 2020-12-13 – 2020-12-17 (×8): 10 mg via ORAL
  Filled 2020-12-13 (×8): qty 2

## 2020-12-13 MED ORDER — ENOXAPARIN SODIUM 30 MG/0.3ML ~~LOC~~ SOLN
30.0000 mg | SUBCUTANEOUS | Status: DC
  Administered 2020-12-13 – 2020-12-15 (×3): 30 mg via SUBCUTANEOUS
  Filled 2020-12-13 (×3): qty 0.3

## 2020-12-13 MED ORDER — HYDROCODONE-ACETAMINOPHEN 5-325 MG PO TABS: 1.0000 | ORAL_TABLET | Freq: Four times a day (QID) | ORAL | Status: DC | PRN

## 2020-12-13 MED ORDER — ACETAMINOPHEN 325 MG PO TABS
325.0000 mg | ORAL_TABLET | Freq: Four times a day (QID) | ORAL | Status: DC | PRN
  Filled 2020-12-13: qty 1

## 2020-12-13 MED ORDER — PRAVASTATIN SODIUM 20 MG PO TABS
40.0000 mg | ORAL_TABLET | Freq: Every day | ORAL | Status: DC
  Administered 2020-12-14 – 2020-12-15 (×2): 40 mg via ORAL
  Filled 2020-12-13 (×2): qty 2

## 2020-12-13 MED ORDER — HYDROCODONE-ACETAMINOPHEN 5-325 MG PO TABS
1.0000 | ORAL_TABLET | Freq: Four times a day (QID) | ORAL | Status: DC | PRN
Start: 2020-12-13 — End: 2020-12-16
  Administered 2020-12-15: 1 via ORAL
  Filled 2020-12-13: qty 1

## 2020-12-13 MED ORDER — MELATONIN 5 MG PO TABS
5.0000 mg | ORAL_TABLET | Freq: Every evening | ORAL | Status: DC | PRN
  Administered 2020-12-15: 5 mg via ORAL
  Filled 2020-12-13: qty 1

## 2020-12-13 MED ORDER — AMLODIPINE BESYLATE 5 MG PO TABS: 10.0000 mg | ORAL_TABLET | Freq: Every day | ORAL | Status: DC

## 2020-12-13 MED ORDER — DONEPEZIL HCL 5 MG PO TABS
5.0000 mg | ORAL_TABLET | Freq: Every day | ORAL | Status: DC
  Administered 2020-12-13 – 2020-12-16 (×4): 5 mg via ORAL
  Filled 2020-12-13 (×4): qty 1

## 2020-12-13 NOTE — H&P (Signed)
History and Physical   Cindy Mcdonald HER:[id number] DOB: September 06, 1930 DOA: 12/13/2020  PCP: Cindy Shaggy, Mcdonald  Outpatient Specialists: Dr. Gwen Mcdonald, cardiology Patient coming from: home  I have personally briefly reviewed patient's old medical records in [facility] EMR.  Chief Concern: fell  HPI: Cindy Mcdonald is a 87 y.o. female with medical history significant for hypertension, dementia, glaucoma, presented to the emergency department for chief concerns of fall.  At bedside, Cindy Mcdonald is able to tell me her full name, age of 87, identified her daughter, Cindy Mcdonald at bedside. She was not able to tell me the year and current president.  Patient denies shortness of breath, chest pain, cough, nausea, fever, dysuria, hematuria.  She does endorse generalized aches and pain at the right arm and tenderness at bruising sites.  Patient was not able to tell me how or when she fell.  Daughter believes that it likely happened approximately 7-8 pm 12/12/20 because patient was still not in her pajamas and in the process of getting ready for bed.  She was found on the floor awake and alert.  Unknown whether there is loss of consciousness.  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 She quit smoking many years ago, 1960s.  She smoked about 5 cigarettes per day.  Patient denies EtOH and recreational drug use.  Vaccinations. Fully vaccinated for COVID-19 including booster   ROS: Constitutional: no weight change, no fever ENT/Mouth: no sore throat, no rhinorrhea Eyes: no eye pain, no vision changes Cardiovascular: no chest pain, no dyspnea,  no edema, no palpitations Respiratory: no cough, no sputum, no wheezing Gastrointestinal: no nausea, no vomiting, no diarrhea, no constipation Genitourinary: no urinary incontinence, no dysuria, no hematuria Musculoskeletal: no arthralgias, no myalgias Skin: + skin lesions, no pruritus, Neuro: + weakness, no loss of consciousness, no syncope Psych:  no anxiety, no depression, + decrease appetite Heme/Lymph: no bruising, no bleeding  ED Course: Discussed with ED provider, patient requiring hospitalization due to inability to ambulate without pain secondary to a fall.  Vitals in the ED revealed temperature of 98, respiration rate of 16, heart rate of 101, blood pressure 120/98, satting at 99% on room air.  Labs in the ED was remarkable for serum creatinine of 1.03, nonfasting glucose of 147, WBC of 15, hemoglobin of 10.6, platelets of 279  Right femur x-ray showed periprosthetic fracture involving the right greater trochanter with mild displacement.  Assessment/Plan  Principal Problem:   Hip fracture (HCC) Active Problems:   Essential hypertension   CAD (coronary artery disease)   Hyperlipidemia   Carotid stenosis, asymptomatic, left    # Periprosthetic fracture of the right greater trochanter with mild displacement -Orthopedic surgeon, Cindy Mcdonald has been consulted and recommends no surgery at this time -Pain control: Acetaminophen 325 mg p.o. every 6 hours as needed for mild pain, fever, headaches, Norco 5 mg every 6 hours as needed for moderate pain, morphine 0.5 mg IV every 2 hours as needed for severe pain -TOC, PT, OT  # Possible bony erosion concerning for fungal infection-ENT, Cindy Mcdonald has been consulted and will see the patient  # Advanced dementia-donepezil 5 mg nightly, memantine 10 mg twice daily resumed -Would recommend a family member to be able to stay overnight with patient to prevent delirium, sundowning  # Protein malnutrition-present on admission, dietary has been consulted # Diffuse ecchymosis of the face and arms-present on admission secondary to fall # Hypertension-losartan 25 mg daily resumed, amlodipine 5 mg  daily # Hyperlipidemia-Pravastatin 40 mg qhs resumed # Glaucoma-Dorzolamine - timolol 22.3 - 6.8 1 drop both eyes bid resumed  Chart reviewed.   DVT prophylaxis: TED hose, enoxaparin Code  Status: DNR Diet: Heart healthy Family Communication: Updated daughter, Cindy BattenKim who is the healthcare power of attorney Disposition Plan: Pending clinical course, PT, OT, SNF placement Consults called: PT, OT Admission status: MedSurg, observation, no telemetry  Past Medical History:  Diagnosis Date  . Arthritis   . Carotid artery occlusion   . Carotid artery occlusion   . Complication of anesthesia    difficulty waking up for 3 days after back surgery  . Hyperlipidemia   . Hypertension    Past Surgical History:  Procedure Laterality Date  . BACK SURGERY    . CAROTID STENT    . CATARACT EXTRACTION, BILATERAL Bilateral   . ENDARTERECTOMY Left 05/02/2018   Procedure: ENDARTERECTOMY CAROTID;  Surgeon: Cindy Mcdonald, Cindy Mcdonald;  Location: [facility] ORS;  Service: Vascular;  Laterality: Left;  . JOINT REPLACEMENT    . REPLACEMENT TOTAL HIP W/  RESURFACING IMPLANTS     times two  . TONSILLECTOMY    . TOTAL SHOULDER REPLACEMENT Right    Social History:  reports that she has never smoked. She has never used smokeless tobacco. She reports previous alcohol use. She reports that she does not use drugs.  Allergies  Allergen Reactions  . Codeine     Unknown  . Nsaids     Dizziness, tears up stomach  . Statins     Leg pain    Family History  Problem Relation Age of Onset  . Hypertension Mother   . Heart disease Father    Family history: Family history reviewed and not pertinent  Prior to Admission medications   Medication Sig Start Date End Date Taking? Authorizing Provider  acetaminophen (TYLENOL) 500 MG tablet Take 500 mg by mouth 2 (two) times daily as needed for moderate pain or headache.    Provider, Historical, Mcdonald  amLODipine (NORVASC) 5 MG tablet Take 5 mg by mouth once daily 02/23/18   Provider, Historical, Mcdonald  aspirin EC 81 MG tablet Take 81 mg by mouth daily.     Provider, Historical, Mcdonald  Biotin 5000 MCG CAPS Take 5,000 mcg by mouth daily.    Provider, Historical, Mcdonald   Calcium-Vitamin D-Vitamin K (VIACTIV CALCIUM PLUS D PO) Take 1-2 Doses by mouth 2 (two) times daily. 2 viactiv in the morning and 1 at night    Provider, Historical, Mcdonald  dorzolamide-timolol (COSOPT) 22.3-6.8 MG/ML ophthalmic solution INSTILL 1 DROP TO BOTH EYES 2 TIMES A DAY 04/21/16   Provider, Historical, Mcdonald  GARLIC PO Take 1 tablet by mouth daily.    Provider, Historical, Mcdonald  latanoprost (XALATAN) 0.005 % ophthalmic solution Place 1 drop into both eyes at bedtime.  01/15/18   Provider, Historical, Mcdonald  losartan (COZAAR) 25 MG tablet Take 25 mg by mouth at bedtime.  03/27/16   Provider, Historical, Mcdonald  niacin 500 MG tablet Take 500 mg by mouth daily.     Provider, Historical, Mcdonald  Omega-3 Fatty Acids (FISH OIL) 1000 MG CPDR Take 1,000-2,000 mg by mouth See admin instructions. Take 2000 mg in the morning and 1000 mg at night    Provider, Historical, Mcdonald   Physical Exam: Vitals:   12/13/20 1521 12/13/20 1522 12/13/20 1715 12/13/20 2022  BP: 104/62  (!) 120/98 122/69  Pulse: 93  (!) 101 97  Resp: 16  16 16   Temp:  98 F (36.7 C)  98.3 F (36.8 C)  TempSrc:  Oral  Oral  SpO2:  97% 99% 92%  Weight:      Height:       Constitutional: appears age-appropriate, frail, NAD, calm, comfortable Eyes: PERRL, lids and conjunctivae normal ENMT: Mucous membranes are moist. Posterior pharynx clear of any exudate or lesions. Age-appropriate dentition. Hearing appropriate Neck: normal, supple, no masses, no thyromegaly Respiratory: clear to auscultation bilaterally, no wheezing, no crackles. Normal respiratory effort. No accessory muscle use.  Cardiovascular: Regular rate and rhythm, no murmurs / rubs / gallops. No extremity edema. 2+ pedal pulses. No carotid bruits.  Abdomen: no tenderness, no masses palpated, no hepatosplenomegaly. Bowel sounds positive.  Musculoskeletal: no clubbing / cyanosis. No joint deformity upper and lower extremities. Good ROM, no contractures, no atrophy. Normal muscle tone.   Skin: Lateral mid arm skin loss, multiple ecchymosis present, thin skin Neurologic: Sensation intact. Strength 5/5 in all 4.  Psychiatric: Normal judgment and insight. Alert and oriented x 3. Normal mood.   EKG: Not indicated  x-rays on Admission: I personally reviewed and I agree with radiologist reading as below.  CT Head Wo Contrast  Result Date: 12/13/2020 CLINICAL DATA:  Fall, found down, facial injury EXAM: CT HEAD WITHOUT CONTRAST CT MAXILLOFACIAL WITHOUT CONTRAST CT CERVICAL SPINE WITHOUT CONTRAST TECHNIQUE: Multidetector CT imaging of the head, cervical spine, and maxillofacial structures were performed using the standard protocol without intravenous contrast. Multiplanar CT image reconstructions of the cervical spine and maxillofacial structures were also generated. COMPARISON:  CT head neck angiogram, 03/22/2018 FINDINGS: CT HEAD FINDINGS Brain: No evidence of acute infarction, hemorrhage, hydrocephalus, extra-axial collection or mass lesion/mass effect. Mild periventricular and deep white matter hypodensity. Vascular: No hyperdense vessel or unexpected calcification. CT FACIAL BONES FINDINGS Skull: Normal. Negative for fracture or focal lesion. Facial bones: No displaced fractures or dislocations. Sinuses/Orbits: No acute finding. There is partially calcified opacification of the right maxillary sinus with bony thickening and erosion of the posterior sinus wall (series 3, image 33). There is likewise partially calcified opacification of the right sphenoid sinus with bony thickening and erosion of the posterior sinus wall adjoining the middle cranial fossa (series 3, image 27). Sinus opacification is as seen on prior examination dated 03/22/2018 but bony erosion is new. Other: Soft tissue contusion of the right forehead. CT CERVICAL SPINE FINDINGS Alignment: Normal. Skull base and vertebrae: No acute fracture. No primary bone lesion or focal pathologic process. Soft tissues and spinal canal:  No prevertebral fluid or swelling. No visible canal hematoma. Disc levels: Moderate multilevel disc space height loss and osteophytosis. Upper chest: Negative. Other: None. IMPRESSION: 1. No acute intracranial pathology. Small-vessel white matter disease. 2. No displaced fracture or dislocation of the facial bones. 3. There is partially calcified opacification of the right maxillary sinus with bony thickening and erosion of the posterior sinus wall. There is likewise partially calcified opacification of the right sphenoid sinus with bony thickening and erosion of the posterior sinus wall adjoining the middle cranial fossa. Sinus opacification is as seen on prior examination dated 03/22/2018 but bony erosion is new. Findings are consistent with chronic sinusitis, potentially fungal given the presence of bony erosion. 4. No fracture or static subluxation of the cervical spine. Moderate multilevel disc space height loss and osteophytosis. Electronically Signed   By: Lauralyn Primes M.D.   On: 12/13/2020 16:01   CT Cervical Spine Wo Contrast  Result Date: 12/13/2020 CLINICAL DATA:  Fall, found down, facial injury  EXAM: CT HEAD WITHOUT CONTRAST CT MAXILLOFACIAL WITHOUT CONTRAST CT CERVICAL SPINE WITHOUT CONTRAST TECHNIQUE: Multidetector CT imaging of the head, cervical spine, and maxillofacial structures were performed using the standard protocol without intravenous contrast. Multiplanar CT image reconstructions of the cervical spine and maxillofacial structures were also generated. COMPARISON:  CT head neck angiogram, 03/22/2018 FINDINGS: CT HEAD FINDINGS Brain: No evidence of acute infarction, hemorrhage, hydrocephalus, extra-axial collection or mass lesion/mass effect. Mild periventricular and deep white matter hypodensity. Vascular: No hyperdense vessel or unexpected calcification. CT FACIAL BONES FINDINGS Skull: Normal. Negative for fracture or focal lesion. Facial bones: No displaced fractures or dislocations.  Sinuses/Orbits: No acute finding. There is partially calcified opacification of the right maxillary sinus with bony thickening and erosion of the posterior sinus wall (series 3, image 33). There is likewise partially calcified opacification of the right sphenoid sinus with bony thickening and erosion of the posterior sinus wall adjoining the middle cranial fossa (series 3, image 27). Sinus opacification is as seen on prior examination dated 03/22/2018 but bony erosion is new. Other: Soft tissue contusion of the right forehead. CT CERVICAL SPINE FINDINGS Alignment: Normal. Skull base and vertebrae: No acute fracture. No primary bone lesion or focal pathologic process. Soft tissues and spinal canal: No prevertebral fluid or swelling. No visible canal hematoma. Disc levels: Moderate multilevel disc space height loss and osteophytosis. Upper chest: Negative. Other: None. IMPRESSION: 1. No acute intracranial pathology. Small-vessel white matter disease. 2. No displaced fracture or dislocation of the facial bones. 3. There is partially calcified opacification of the right maxillary sinus with bony thickening and erosion of the posterior sinus wall. There is likewise partially calcified opacification of the right sphenoid sinus with bony thickening and erosion of the posterior sinus wall adjoining the middle cranial fossa. Sinus opacification is as seen on prior examination dated 03/22/2018 but bony erosion is new. Findings are consistent with chronic sinusitis, potentially fungal given the presence of bony erosion. 4. No fracture or static subluxation of the cervical spine. Moderate multilevel disc space height loss and osteophytosis. Electronically Signed   By: Lauralyn Primes M.D.   On: 12/13/2020 16:01   Chest Portable 1 View  Result Date: 12/13/2020 CLINICAL DATA:  Fall EXAM: PORTABLE CHEST 1 VIEW COMPARISON:  04/19/2011 FINDINGS: The heart size and mediastinal contours are within normal limits. Both lungs are clear.  The visualized skeletal structures are unremarkable. IMPRESSION: No active disease. Electronically Signed   By: Deatra Robinson M.D.   On: 12/13/2020 19:38   DG Femur Min 2 Views Right  Result Date: 12/13/2020 CLINICAL DATA:  Pain with standing status post fall. EXAM: RIGHT FEMUR 2 VIEWS COMPARISON:  Pelvic x-ray from March 16, 2020. FINDINGS: Post RIGHT total hip arthroplasty. Periprosthetic fracture involving the greater trochanter with mild displacement. No additional fractures about the femur. Soft tissues are unremarkable. IMPRESSION: Periprosthetic fracture involving the RIGHT greater trochanter with mild displacement. Electronically Signed   By: Donzetta Kohut M.D.   On: 12/13/2020 16:24   CT Maxillofacial Wo Contrast  Result Date: 12/13/2020 CLINICAL DATA:  Fall, found down, facial injury EXAM: CT HEAD WITHOUT CONTRAST CT MAXILLOFACIAL WITHOUT CONTRAST CT CERVICAL SPINE WITHOUT CONTRAST TECHNIQUE: Multidetector CT imaging of the head, cervical spine, and maxillofacial structures were performed using the standard protocol without intravenous contrast. Multiplanar CT image reconstructions of the cervical spine and maxillofacial structures were also generated. COMPARISON:  CT head neck angiogram, 03/22/2018 FINDINGS: CT HEAD FINDINGS Brain: No evidence of acute infarction, hemorrhage, hydrocephalus, extra-axial  collection or mass lesion/mass effect. Mild periventricular and deep white matter hypodensity. Vascular: No hyperdense vessel or unexpected calcification. CT FACIAL BONES FINDINGS Skull: Normal. Negative for fracture or focal lesion. Facial bones: No displaced fractures or dislocations. Sinuses/Orbits: No acute finding. There is partially calcified opacification of the right maxillary sinus with bony thickening and erosion of the posterior sinus wall (series 3, image 33). There is likewise partially calcified opacification of the right sphenoid sinus with bony thickening and erosion of the posterior  sinus wall adjoining the middle cranial fossa (series 3, image 27). Sinus opacification is as seen on prior examination dated 03/22/2018 but bony erosion is new. Other: Soft tissue contusion of the right forehead. CT CERVICAL SPINE FINDINGS Alignment: Normal. Skull base and vertebrae: No acute fracture. No primary bone lesion or focal pathologic process. Soft tissues and spinal canal: No prevertebral fluid or swelling. No visible canal hematoma. Disc levels: Moderate multilevel disc space height loss and osteophytosis. Upper chest: Negative. Other: None. IMPRESSION: 1. No acute intracranial pathology. Small-vessel white matter disease. 2. No displaced fracture or dislocation of the facial bones. 3. There is partially calcified opacification of the right maxillary sinus with bony thickening and erosion of the posterior sinus wall. There is likewise partially calcified opacification of the right sphenoid sinus with bony thickening and erosion of the posterior sinus wall adjoining the middle cranial fossa. Sinus opacification is as seen on prior examination dated 03/22/2018 but bony erosion is new. Findings are consistent with chronic sinusitis, potentially fungal given the presence of bony erosion. 4. No fracture or static subluxation of the cervical spine. Moderate multilevel disc space height loss and osteophytosis. Electronically Signed   By: Lauralyn Primes M.D.   On: 12/13/2020 16:01   Labs on Admission: I have personally reviewed following labs  CBC: Recent Labs  Lab 12/13/20 1527  WBC 15.0*  NEUTROABS 12.8*  HGB 10.6*  HCT 32.4*  MCV 88.5  PLT 279   Basic Metabolic Panel: Recent Labs  Lab 12/13/20 1527  NA 132*  K 4.3  CL 98  CO2 25  GLUCOSE 147*  BUN 30*  CREATININE 1.03*  CALCIUM 9.1   GFR: Estimated Creatinine Clearance: 28.4 mL/min (A) (by C-G formula based on SCr of 1.03 mg/dL (H)).  Liver Function Tests: Recent Labs  Lab 12/13/20 1527  AST 30  ALT 15  ALKPHOS 60  BILITOT  0.7  PROT 6.6  ALBUMIN 3.5   Cardiac Enzymes: Recent Labs  Lab 12/13/20 1527  CKTOTAL 184   Urine analysis:    Component Value Date/Time   COLORURINE YELLOW 03/08/2010 1055   APPEARANCEUR CLOUDY (A) 03/08/2010 1055   LABSPEC 1.016 03/08/2010 1055   PHURINE 6.5 03/08/2010 1055   GLUCOSEU NEGATIVE 03/08/2010 1055   HGBUR NEGATIVE 03/08/2010 1055   BILIRUBINUR NEGATIVE 03/08/2010 1055   KETONESUR NEGATIVE 03/08/2010 1055   PROTEINUR NEGATIVE 03/08/2010 1055   UROBILINOGEN 0.2 03/08/2010 1055   NITRITE POSITIVE (A) 03/08/2010 1055   LEUKOCYTESUR LARGE (A) 03/08/2010 1055   Najee Manninen N Dezarae Mcclaran D.O. [facility]  If 7PM-7AM, please contact overnight-coverage provider If 7AM-7PM, please contact day coverage provider [email or web address]  12/13/2020, 9:30 PM

## 2020-12-13 NOTE — ED Notes (Signed)
Dr. Cox at bedside.  

## 2020-12-13 NOTE — ED Notes (Signed)
Patient noted to have abrasion to right elbow, cleansed with saline, left open to air.

## 2020-12-13 NOTE — ED Provider Notes (Signed)
[facility] Emergency Department Provider Note   ____________________________________________   I have reviewed the triage vital signs and the nursing notes.   HISTORY  Chief Complaint Left hip pain  History limited by and level 5 caveat due to: Memory issues.   HPI Cindy Mcdonald is a 87 y.o. female who presents to the emergency department today with primary complaint of right hip pain. The patient is accompanied by the daughter. Daughter states that patient was found on the ground this morning. Was still wearing the clothes she had on last night so there is concern she might have been on the ground overnight. The patient does state she remembers falling but is unsure how she fell. Is complaining of primarily right hip pain. Also has bruising around her head but no significant headache. The patient denies any recent chest pain, shortness of breath or fevers.   Records reviewed. Per medical record review patient has a history of HTN, HLD, previous right hip replacement.   Past Medical History:  Diagnosis Date  . Arthritis   . Carotid artery occlusion   . Carotid artery occlusion   . Complication of anesthesia    difficulty waking up for 3 days after back surgery  . Hyperlipidemia   . Hypertension     Patient Active Problem List   Diagnosis Date Noted  . Carotid stenosis, asymptomatic, left 05/02/2018  . Carotid stenosis 03/08/2018  . Essential hypertension 03/08/2018  . CAD (coronary artery disease) 03/08/2018  . Hyperlipidemia 03/08/2018    Past Surgical History:  Procedure Laterality Date  . BACK SURGERY    . CAROTID STENT    . CATARACT EXTRACTION, BILATERAL Bilateral   . ENDARTERECTOMY Left 05/02/2018   Procedure: ENDARTERECTOMY CAROTID;  Surgeon: Renford Dills, MD;  Location: [facility] ORS;  Service: Vascular;  Laterality: Left;  . JOINT REPLACEMENT    . REPLACEMENT TOTAL HIP W/  RESURFACING IMPLANTS     times two  . TONSILLECTOMY    .  TOTAL SHOULDER REPLACEMENT Right     Prior to Admission medications   Medication Sig Start Date End Date Taking? Authorizing Provider  acetaminophen (TYLENOL) 500 MG tablet Take 500 mg by mouth 2 (two) times daily as needed for moderate pain or headache.    [provider]  amLODipine (NORVASC) 5 MG tablet Take 5 mg by mouth once daily 02/23/18   [provider]  aspirin EC 81 MG tablet Take 81 mg by mouth daily.     [provider]  Biotin 5000 MCG CAPS Take 5,000 mcg by mouth daily.    [provider]  Calcium-Vitamin D-Vitamin K (VIACTIV CALCIUM PLUS D PO) Take 1-2 Doses by mouth 2 (two) times daily. 2 viactiv in the morning and 1 at night    [provider]  dorzolamide-timolol (COSOPT) 22.3-6.8 MG/ML ophthalmic solution INSTILL 1 DROP TO BOTH EYES 2 TIMES A DAY 04/21/16   [provider]  GARLIC PO Take 1 tablet by mouth daily.    [provider]  latanoprost (XALATAN) 0.005 % ophthalmic solution Place 1 drop into both eyes at bedtime.  01/15/18   [provider]  losartan (COZAAR) 25 MG tablet Take 25 mg by mouth at bedtime.  03/27/16   [provider]  niacin 500 MG tablet Take 500 mg by mouth daily.     [provider]  Omega-3 Fatty Acids (FISH OIL) 1000 MG CPDR Take 1,000-2,000 mg by mouth See admin instructions. Take 2000  mg in the morning and 1000 mg at night    [provider]    Allergies Codeine, Nsaids, and Statins  Family History  Problem Relation Age of Onset  . Hypertension Mother   . Heart disease Father     Social History Social History   Tobacco Use  . Smoking status: Never Smoker  . Smokeless tobacco: Never Used  Vaping Use  . Vaping Use: Never used  Substance Use Topics  . Alcohol use: Not Currently  . Drug use: Never    Review of Systems Constitutional: No fever/chills Eyes: No visual changes. ENT: No sore throat. Cardiovascular: Denies chest  pain. Respiratory: Denies shortness of breath. Gastrointestinal: No abdominal pain.  No nausea, no vomiting.  No diarrhea.   Genitourinary: Negative for dysuria. Musculoskeletal: Positive for right hip pain. Skin: Positive for bruising around the right eye.  Neurological: Negative for headaches, focal weakness or numbness.  ____________________________________________   PHYSICAL EXAM:  VITAL SIGNS: ED Triage Vitals  Enc Vitals Group     BP 12/13/20 1521 104/62     Pulse Rate 12/13/20 1521 93     Resp 12/13/20 1521 16     Temp 12/13/20 1522 98 F (36.7 C)     Temp Source 12/13/20 1522 Oral     SpO2 12/13/20 1522 97 %     Weight 12/13/20 1519 105 lb (47.6 kg)     Height 12/13/20 1519 5\' 2"  (1.575 m)     Head Circumference --      Peak Flow --      Pain Score 12/13/20 1518 5   Constitutional: Alert and oriented.  Eyes: Conjunctivae are normal. EOMI. ENT      Head: Normocephalic. Ecchymosis around right eye.       Nose: No congestion/rhinnorhea.      Mouth/Throat: Mucous membranes are moist.      Neck: No stridor. Hematological/Lymphatic/Immunilogical: No cervical lymphadenopathy. Cardiovascular: Normal rate, regular rhythm.  No murmurs, rubs, or gallops.  Respiratory: Normal respiratory effort without tachypnea nor retractions. Breath sounds are clear and equal bilaterally. No wheezes/rales/rhonchi. Gastrointestinal: Soft and non tender. No rebound. No guarding.  Genitourinary: Deferred Musculoskeletal: Tender to palpation and manipulation of the right hip.  Neurologic:  Normal speech and language. No gross focal neurologic deficits are appreciated.  Skin:  Skin is warm, dry and intact. No rash noted. Psychiatric: Mood and affect are normal. Speech and behavior are normal. Patient exhibits appropriate insight and judgment.  ____________________________________________    LABS (pertinent positives/negatives)  CK 184 CBC wbc 15.0, hgb 10.6, plt 279 CMP na 132, k 4.3,  glu 147, cr 1.03 Trop hs 18  ____________________________________________   EKG  None  ____________________________________________    RADIOLOGY  Right femur Periprosthetic fracture of the right greater trochanter.   CT head/cervical spine/max face No acute intracranial abnormality. Concern for chronic fungal sinusitis given opacification and some bony erosion.   ____________________________________________   PROCEDURES  Procedures  ____________________________________________   INITIAL IMPRESSION / ASSESSMENT AND PLAN / ED COURSE  Pertinent labs & imaging results that were available during my care of the patient were reviewed by me and considered in my medical decision making (see chart for details).   Patient presents to the emergency department today with primary complaint of right hip pain after a fall last night. X-ray shows periprosthetic fracture. Discussed with Dr. 12/15/20 with orthopedics. Not a surgical fracture. Additionally the patient had head imaging done which did not show any acute abnormality but  did show findings concerning for chronic fungal sinusitis. At this time will plan on admission. Discussed findings with patient.  ____________________________________________   FINAL CLINICAL IMPRESSION(S) / ED DIAGNOSES  Final diagnoses:  Closed fracture of right hip, initial encounter [facility])  Fall, initial encounter     Note: This dictation was prepared with Nurse, children's dictation. Any transcriptional errors that result from this process are unintentional     Phineas Semen, MD 12/13/20 1806

## 2020-12-13 NOTE — ED Triage Notes (Signed)
Pt to ED via POV with daughter who states that pt fell sometime last night and she found her in the floor this morning around 1100. Pt daughter states that she was still wearing her clothes from yesterday so she thinks that pt had been in the floor since around 1900 last night. Pt states that she does remember falling but is not sure why she fell. Pt denies LOC. Pt has extensive bruising to the right side of her face, bruising to the right forearm, and a skin tear on her right elbow. Pt has swelling above right eye. Pt is in NAD.

## 2020-12-14 ENCOUNTER — Observation Stay: Payer: Medicare HMO

## 2020-12-14 DIAGNOSIS — S72001A Fracture of unspecified part of neck of right femur, initial encounter for closed fracture: Secondary | ICD-10-CM | POA: Diagnosis not present

## 2020-12-14 DIAGNOSIS — I10 Essential (primary) hypertension: Secondary | ICD-10-CM | POA: Diagnosis not present

## 2020-12-14 DIAGNOSIS — F0390 Unspecified dementia without behavioral disturbance: Secondary | ICD-10-CM | POA: Diagnosis not present

## 2020-12-14 LAB — CBC
HCT: 30 % — ABNORMAL LOW (ref 36.0–46.0)
Hemoglobin: 9.5 g/dL — ABNORMAL LOW (ref 12.0–15.0)
MCH: 28.8 pg (ref 26.0–34.0)
MCHC: 31.7 g/dL (ref 30.0–36.0)
MCV: 90.9 fL (ref 80.0–100.0)
Platelets: 213 10*3/uL (ref 150–400)
RBC: 3.3 MIL/uL — ABNORMAL LOW (ref 3.87–5.11)
RDW: 14.2 % (ref 11.5–15.5)
WBC: 10.4 10*3/uL (ref 4.0–10.5)
nRBC: 0 % (ref 0.0–0.2)

## 2020-12-14 LAB — BASIC METABOLIC PANEL
Anion gap: 8 (ref 5–15)
BUN: 23 mg/dL (ref 8–23)
CO2: 26 mmol/L (ref 22–32)
Calcium: 8.8 mg/dL — ABNORMAL LOW (ref 8.9–10.3)
Chloride: 100 mmol/L (ref 98–111)
Creatinine, Ser: 1.07 mg/dL — ABNORMAL HIGH (ref 0.44–1.00)
GFR, Estimated: 50 mL/min — ABNORMAL LOW (ref >60)
Glucose, Bld: 102 mg/dL — ABNORMAL HIGH (ref 70–99)
Potassium: 4.3 mmol/L (ref 3.5–5.1)
Sodium: 134 mmol/L — ABNORMAL LOW (ref 135–145)

## 2020-12-14 LAB — SARS CORONAVIRUS 2 (TAT 6-24 HRS): SARS Coronavirus 2: NEGATIVE

## 2020-12-14 MED ORDER — ENSURE ENLIVE PO LIQD
237.0000 mL | Freq: Three times a day (TID) | ORAL | Status: DC
  Administered 2020-12-14 – 2020-12-17 (×10): 237 mL via ORAL

## 2020-12-14 MED ORDER — ADULT MULTIVITAMIN W/MINERALS CH
1.0000 | ORAL_TABLET | Freq: Every day | ORAL | Status: DC
  Administered 2020-12-14 – 2020-12-17 (×4): 1 via ORAL
  Filled 2020-12-14 (×4): qty 1

## 2020-12-14 MED ORDER — GADOBUTROL 1 MMOL/ML IV SOLN
5.0000 mL | Freq: Once | INTRAVENOUS | Status: AC | PRN
  Administered 2020-12-14: 5 mL via INTRAVENOUS

## 2020-12-14 NOTE — Evaluation (Signed)
Occupational Therapy Evaluation Patient Details Name: Cindy Mcdonald MRN: [id number] DOB: 08/28/1930 Today's Date: 12/14/2020    History of Present Illness Pt is an 87 y.o. female with medical history significant for hypertension, dementia, glaucoma, presented to the emergency department for chief concerns of fall 12/12/2020. Xray showed Periprosthetic fracture of the right greater trochanter with mild displacement. Per Dr. Joice Lofts, no surgical intervention needed and WBAT with RW and assistance as needed   Clinical Impression   Pt seen for OT evaluation this date. Upon arrival to room, pt sitting upright in bed with daughter present. Pt oriented to self, place, and able to recall some details of situation. Pt agreeable to OT eval/tx this date. Prior to admission, pt was independent with ADLs, MOD-I with [facility] for functional mobility (with occasional HHA from family), and was living alone in a 1-story home. Daughter reports that pt has been having increased difficulty with sit>stand transfers, occasionally requiring MIN A to initiate, but reports no prior falls. Pt has meals on wheels, and daughters check in on patient for ~2hrs/day. Pt currently presents with decreased strength, balance, activity tolerance, safety awareness, and ROM, and currently requires MAX A for stand pivot transfers with RW, MAX A for bed mobility, and SET-UP A for bed-level grooming. Caregiver provided information regarding [employer]'s HomeFit guide to ensure safe home set-up following pt return to home. Pt would benefit from additional skilled OT services to maximize return to PLOF and minimize risk of future falls, injury, caregiver burden, and readmission. Upon discharge, recommend SNF.      Follow Up Recommendations  SNF    Equipment Recommendations  Other (comment) (defer to next venue of care)       Precautions / Restrictions Precautions Precautions: Fall Restrictions Weight Bearing Restrictions: Yes RLE Weight Bearing:  Weight bearing as tolerated      Mobility Bed Mobility Overal bed mobility: Needs Assistance Bed Mobility: Sit to Supine     Supine to sit: HOB elevated;Max assist Sit to supine: Max assist   General bed mobility comments: MAX A to return to supine and MAX A to scoot towards HOB    Transfers Overall transfer level: Needs assistance Equipment used: Rolling walker (2 wheeled) Transfers: Sit to/from [employer] Sit to Stand: Max assist Stand pivot transfers: Max assist       General transfer comment: Required multimodal cues for body positioning and physical assistance to pivot L LE during stand pivot transfer    Balance Overall balance assessment: Needs assistance Sitting-balance support: No upper extremity supported;Feet supported Sitting balance-Leahy Scale: Good Sitting balance - Comments: Good dynamic sitting at EOB; no LOB observed   Standing balance support: Bilateral upper extremity supported;During functional activity Standing balance-Leahy Scale: Poor Standing balance comment: UE reliance on RW and physical assist to maintain balance and pivot L LE during stand pivot transfer                           ADL either performed or assessed with clinical judgement   ADL Overall ADL's : Needs assistance/impaired     Grooming: Brushing hair;Set up;Bed level                               Functional mobility during ADLs: Maximal assistance;Rolling walker;Cueing for sequencing                    Pertinent Vitals/Pain  Pain Assessment: Faces Faces Pain Scale: Hurts a little bit Pain Location: R hip Pain Descriptors / Indicators: Grimacing;Guarding Pain Intervention(s): Limited activity within patient's tolerance;Monitored during session;Repositioned        Extremity/Trunk Assessment Upper Extremity Assessment Upper Extremity Assessment: Generalized weakness RUE Deficits / Details: Shoulder flexion <70 degrees. Grip  WFL LUE Deficits / Details: ROM WFLs, strength grossly 3+/5   Lower Extremity Assessment Lower Extremity Assessment: Defer to PT evaluation RLE Deficits / Details: unable to lift against gravity without assist LLE Deficits / Details: able to move against gravity independently   Cervical / Trunk Assessment Cervical / Trunk Assessment: Normal   Communication Communication Communication: HOH   Cognition Arousal/Alertness: Awake/alert Behavior During Therapy: WFL for tasks assessed/performed Overall Cognitive Status: History of cognitive impairments - at baseline                                 General Comments: Pt oriented to self and place able to recall some details of situation. Daughter reports significant change in cognition over past 2-3 months, with pt initiating less social interactions than before   General Comments  Bruise on the right side of forehead            Home Living Family/patient expects to be discharged to:: Private residence Living Arrangements: Alone Available Help at Discharge: Family Type of Home: House Home Access: Ramped entrance     Home Layout: One level     Bathroom Shower/Tub: Arts development officer Toilet: Handicapped height     Home Equipment: Environmental consultant - standard;Cane - single point;Grab bars - tub/shower   Additional Comments: no other falls. had been seeing PT in outpatient      Prior Functioning/Environment Level of Independence: Needs assistance  Gait / Transfers Assistance Needed: Pt used a SPC (with occasional HHA from family) for functional mobility. Pt has access to a walker but does not use it. Daughter reports that pt has been having increased difficulty with sit>stand transfers requiring occasionally assistance, but reports no prior falls. Pt was seeing PT in outpatient ADL's / Homemaking Assistance Needed: Pt was independent with dressing and bathing (showers 2x/week and spongebathes other days). Pt has meals  on wheels, and daughters check in on patient for ~2hrs/day; daughters concerned that pt is not eating/drinking enough when home alone and note significant weight loss over the past few months            OT Problem List: Decreased strength;Decreased range of motion;Decreased activity tolerance;Impaired balance (sitting and/or standing);Decreased cognition;Decreased safety awareness;Decreased knowledge of precautions      OT Treatment/Interventions: Self-care/ADL training;Therapeutic exercise;Energy conservation;DME and/or AE instruction;Therapeutic activities;Patient/family education;Balance training    OT Goals(Current goals can be found in the care plan section) Acute Rehab OT Goals Patient Stated Goal: to get up and move OT Goal Formulation: With patient Time For Goal Achievement: 12/28/20 Potential to Achieve Goals: Good ADL Goals Pt Will Perform Grooming: with min assist;standing Pt Will Transfer to Toilet: with min guard assist;ambulating;regular height toilet Pt Will Perform Toileting - Clothing Manipulation and hygiene: with min assist;sit to/from stand  OT Frequency: Min 1X/week    AM-PAC OT "6 Clicks" Daily Activity     Outcome Measure Help from another person eating meals?: None Help from another person taking care of personal grooming?: A Little Help from another person toileting, which includes using toliet, bedpan, or urinal?: A Lot Help from another person  bathing (including washing, rinsing, drying)?: A Lot Help from another person to put on and taking off regular upper body clothing?: A Little Help from another person to put on and taking off regular lower body clothing?: A Lot 6 Click Score: 16   End of Session Equipment Utilized During Treatment: Gait belt;Rolling walker Nurse Communication: Mobility status  Activity Tolerance: Patient tolerated treatment well Patient left: in bed;with call bell/phone within reach;with bed alarm set;with nursing/sitter in  room;with family/visitor present  OT Visit Diagnosis: Unsteadiness on feet (R26.81);Muscle weakness (generalized) (M62.81)                Time: 3428-7681 OT Time Calculation (min): 32 min Charges:  OT General Charges $OT Visit: 1 Visit OT Evaluation $OT Eval Moderate Complexity: 1 Mod OT Treatments $Therapeutic Activity: 8-22 mins  Matthew Folks, OTR/L [facility] [phone]

## 2020-12-14 NOTE — NC FL2 (Signed)
[place] MEDICAID FL2 LEVEL OF CARE SCREENING TOOL     IDENTIFICATION  Patient Name: Cindy Mcdonald Birthdate: 18-Jan-1930 Sex: female Admission Date (Current Location): 12/13/2020  [place] and [place] Number:  Chiropodist and Address:  [facility], [address], [place], [place] [address]      Provider Number: [id number]  Attending Physician Name and Address:  [name], MD  Relative Name and Phone Number:       Current Level of Care: Hospital Recommended Level of Care: Skilled Nursing Facility Prior Approval Number:    Date Approved/Denied:   PASRR Number: [id number]  Discharge Plan: SNF    Current Diagnoses: Patient Active Problem List   Diagnosis Date Noted  . Hip fracture (HCC) 12/13/2020  . Protein malnutrition (HCC) 12/13/2020  . Carotid stenosis, asymptomatic, left 05/02/2018  . Carotid stenosis 03/08/2018  . Essential hypertension 03/08/2018  . CAD (coronary artery disease) 03/08/2018  . Hyperlipidemia 03/08/2018    Orientation RESPIRATION BLADDER Height & Weight     Self,Time,Place  Normal Continent,External catheter Weight: 105 lb (47.6 kg) Height:  5\' 2"  (157.5 cm)  BEHAVIORAL SYMPTOMS/MOOD NEUROLOGICAL BOWEL NUTRITION STATUS   (None)  (None) Continent Diet (Heart healthy)  AMBULATORY STATUS COMMUNICATION OF NEEDS Skin   Limited Assist Verbally Skin abrasions,Bruising                       Personal Care Assistance Level of Assistance  Bathing,Feeding,Dressing Bathing Assistance: Maximum assistance Feeding assistance: Limited assistance Dressing Assistance: Maximum assistance     Functional Limitations Info  Sight,Hearing,Speech Sight Info: Adequate Hearing Info: Adequate Speech Info: Adequate    SPECIAL CARE FACTORS FREQUENCY  OT (By licensed OT)     PT Frequency: 5 x week OT Frequency: 5 x week            Contractures Contractures Info: Not present    Additional Factors  Info  Code Status,Allergies Code Status Info: DNR Allergies Info: Codeine, Nsaids, Statins           Current Medications (12/14/2020):  This is the current hospital active medication list Current Facility-Administered Medications  Medication Dose Route Frequency Provider Last Rate Last Admin  . acetaminophen (TYLENOL) tablet 325 mg  325 mg Oral Q6H PRN Cox, Amy N, DO      . amLODipine (NORVASC) tablet 5 mg  5 mg Oral Daily Cox, Amy N, DO   5 mg at 12/14/20 0843  . donepezil (ARICEPT) tablet 5 mg  5 mg Oral QHS Cox, Amy N, DO   5 mg at 12/13/20 2206  . dorzolamide-timolol (COSOPT) 22.3-6.8 MG/ML ophthalmic solution 1 drop  1 drop Both Eyes BID Cox, Amy N, DO   1 drop at 12/14/20 0844  . enoxaparin (LOVENOX) injection 30 mg  30 mg Subcutaneous Q24H Cox, Amy N, DO   30 mg at 12/13/20 2211  . feeding supplement (ENSURE ENLIVE / ENSURE PLUS) liquid 237 mL  237 mL Oral TID BM 2212, MD      . HYDROcodone-acetaminophen (NORCO/VICODIN) 5-325 MG per tablet 1 tablet  1 tablet Oral Q6H PRN Cox, Amy N, DO      . latanoprost (XALATAN) 0.005 % ophthalmic solution 1 drop  1 drop Both Eyes QHS Cox, Amy N, DO   1 drop at 12/13/20 2212  . losartan (COZAAR) tablet 25 mg  25 mg Oral Daily Cox, Amy N, DO   25 mg at 12/14/20 0842  .  melatonin tablet 5 mg  5 mg Oral QHS PRN Cox, Amy N, DO      . memantine (NAMENDA) tablet 10 mg  10 mg Oral BID Cox, Amy N, DO   10 mg at 12/14/20 0843  . morphine 2 MG/ML injection 0.5 mg  0.5 mg Intravenous Q2H PRN Cox, Amy N, DO      . multivitamin with minerals tablet 1 tablet  1 tablet Oral Daily [name], MD      . ondansetron [facility]) injection 4 mg  4 mg Intravenous Q6H PRN Cox, Amy N, DO      . pravastatin (PRAVACHOL) tablet 40 mg  40 mg Oral q1800 Cox, Amy N, DO         Discharge Medications: Please see discharge summary for a list of discharge medications.  Relevant Imaging Results:  Relevant Lab Results:   Additional Information SS#:  [id number]  Margarito Liner, LCSW

## 2020-12-14 NOTE — Care Management Obs Status (Signed)
MEDICARE OBSERVATION STATUS NOTIFICATION   Patient Details  Name: Cindy Mcdonald MRN: [id number] Date of Birth: 1929/11/01   Medicare Observation Status Notification Given:  Yes (Discussed with both daughters at bedside. Daughter Selena Batten was in room when CSW returned to give Aurther Loft a copy.)    Margarito Liner, LCSW 12/14/2020, 4:33 PM

## 2020-12-14 NOTE — Progress Notes (Addendum)
Initial Nutrition Assessment  DOCUMENTATION CODES:   Severe malnutrition in context of chronic illness  INTERVENTION:   -Ensure Enlive po BTD, each supplement provides 350 kcal and 20 grams of protein -MVI with minerals daily -Feeding assistance with meals -Obtained food preferences and entered into [employer] meal ordering system  NUTRITION DIAGNOSIS:   Severe Malnutrition related to chronic illness (dementia) as evidenced by energy intake < or equal to 75% for > or equal to 1 month,severe fat depletion,moderate fat depletion,moderate muscle depletion,severe muscle depletion.  GOAL:   Patient will meet greater than or equal to 90% of their needs  MONITOR:   PO intake,Supplement acceptance,Weight trends,Skin,I & O's  REASON FOR ASSESSMENT:   Consult Assessment of nutrition requirement/status,Hip fracture protocol  ASSESSMENT:   Cindy Mcdonald is a 87 y.o. female with medical history significant for hypertension, dementia, glaucoma, presented to the emergency department for chief concerns of fall.  Pt admitted with periprosthetic fracture of the right greater trochanter with mild displacement.   Reviewed I/O's: -680 ml x 24 hours   Per orthopedics notes, no plan for surgical management.   Spoke with pt daughter at bedside, who reports a general decline in health over the past 2 months. Pt will eat if she is reminded and the foods is cut up and put in front of her. However, pt often forgets to eat and take her Ensure supplements PTA. Pt daughter does not think that she ate much breakfast (meal completion 20%).   Per wt hx; pt with distant history of weight loss. Per daughter, pt usually weighs about 135-140# and estimates she last lost about 19 pounds within the past 2 months.  Discussed importance of good meal and supplement intake to promote healing. Obtained food preferences. Pt daughter amenable to Ensure supplements.   Medications reviewed and include lovenox.    Labs reviewed: Na: 134.   NUTRITION - FOCUSED PHYSICAL EXAM:  Flowsheet Row Most Recent Value  Orbital Region Severe depletion  Upper Arm Region Severe depletion  Thoracic and Lumbar Region Moderate depletion  Buccal Region Moderate depletion  Temple Region Moderate depletion  Clavicle Bone Region Severe depletion  Clavicle and Acromion Bone Region Severe depletion  Scapular Bone Region Severe depletion  Dorsal Hand Moderate depletion  Patellar Region Moderate depletion  Anterior Thigh Region Moderate depletion  Posterior Calf Region Moderate depletion  Edema (RD Assessment) None  Hair Reviewed  Eyes Reviewed  Mouth Reviewed  Skin Reviewed  Nails Reviewed       Diet Order:   Diet Order            Diet Heart Room service appropriate? Yes; Fluid consistency: Thin  Diet effective now                 EDUCATION NEEDS:   Education needs have been addressed  Skin:  Skin Assessment: Reviewed RN Assessment  Last BM:  12/13/20  Height:   Ht Readings from Last 1 Encounters:  12/13/20 5\' 2"  (1.575 m)    Weight:   Wt Readings from Last 1 Encounters:  12/13/20 47.6 kg    Ideal Body Weight:  50 kg  BMI:  Body mass index is 19.2 kg/m.  Estimated Nutritional Needs:   Kcal:  1550-1750  Protein:  85-100 grams  Fluid:  > 1.5 L    12/15/20, RD, LDN, CDCES Registered Dietitian II Certified Diabetes Care and Education Specialist Please refer to [facility] for RD and/or RD on-call/weekend/after hours pager

## 2020-12-14 NOTE — TOC Initial Note (Signed)
Transition of Care [facility]) - Initial/Assessment Note    Patient Details  Name: Cindy Mcdonald MRN: [id number] Date of Birth: [birth year]  Transition of Care [facility]) CM/SW Contact:    Margarito Liner, LCSW Phone Number: 12/14/2020, 5:04 PM  Clinical Narrative:  Patient not fully oriented, off unit for MRI. Daughters in room. CSW introduced role and explained that PT recommendations would be discussed. Patient's daughters are agreeable to SNF placement. Will review bed offers when available. Plan to return home after rehab and hire personal caregivers. No further concerns. CSW encouraged patient's daughters to contact CSW as needed. CSW will continue to follow patient and her daughters for support and facilitate discharge to SNF when medically stable.     Expected Discharge Plan: Skilled Nursing Facility Barriers to Discharge: Insurance Authorization,Continued Medical Work up   Patient Goals and CMS Choice   CMS Medicare.gov Compare Post Acute Care list provided to:: Patient Represenative (must comment) (Instructed daughters on how to access online.)    Expected Discharge Plan and Services Expected Discharge Plan: Skilled Nursing Facility     Post Acute Care Choice: Skilled Nursing Facility Living arrangements for the past 2 months: Single Family Home                                      Prior Living Arrangements/Services Living arrangements for the past 2 months: Single Family Home Lives with:: Self Patient language and need for interpreter reviewed:: Yes Do you feel safe going back to the place where you live?: Yes      Need for Family Participation in Patient Care: Yes (Comment) Care giver support system in place?: Yes (comment)   Criminal Activity/Legal Involvement Pertinent to Current Situation/Hospitalization: No - Comment as needed  Activities of Daily Living Home Assistive Devices/Equipment: Cane (specify quad or straight),Eyeglasses,Walker (specify type) ADL  Screening (condition at time of admission) Patient's cognitive ability adequate to safely complete daily activities?: Yes Is the patient deaf or have difficulty hearing?: Yes Does the patient have difficulty seeing, even when wearing glasses/contacts?: No Does the patient have difficulty concentrating, remembering, or making decisions?: No Patient able to express need for assistance with ADLs?: Yes Does the patient have difficulty dressing or bathing?: No Independently performs ADLs?: Yes (appropriate for developmental age) Does the patient have difficulty walking or climbing stairs?: Yes Weakness of Legs: Right Weakness of Arms/Hands: None  Permission Sought/Granted Permission sought to share information with : Facility Contact Representative,Family Supports    Share Information with NAME: Cindy Mcdonald, Cindy Mcdonald  Permission granted to share info w AGENCY: SNF's  Permission granted to share info w Relationship: Daughters  Permission granted to share info w Contact Information: [name]: [phone], [name]:[phone]  Emotional Assessment Appearance:: Appears stated age Attitude/Demeanor/Rapport: Unable to Assess Affect (typically observed): Unable to Assess Orientation: : Oriented to Self,Oriented to Place,Oriented to  Time Alcohol / Substance Use: Not Applicable Psych Involvement: No (comment)  Admission diagnosis:  Hip fracture (HCC) [S72.009A] Fall [W19.XXXA] Fall, initial encounter [id number]] Closed fracture of right hip, initial encounter [facility]) [S72.001A] Patient Active Problem List   Diagnosis Date Noted  . Hip fracture (HCC) 12/13/2020  . Protein malnutrition (HCC) 12/13/2020  . Carotid stenosis, asymptomatic, left 05/02/2018  . Carotid stenosis 03/08/2018  . Essential hypertension 03/08/2018  . CAD (coronary artery disease) 03/08/2018  . Hyperlipidemia 03/08/2018   PCP:  Jaclyn Shaggy, MD Pharmacy:   [facility] - [place], [place] - [address] [place] [address] Phone: [phone] Fax: [phone]     Social Determinants of Health (SDOH) Interventions    Readmission Risk Interventions No flowsheet data found.

## 2020-12-14 NOTE — Evaluation (Signed)
Physical Therapy Evaluation Patient Details Name: Cindy Mcdonald MRN: [id number] DOB: [birth year] Today's Date: 12/14/2020   History of Present Illness  Pt is an 87 y.o. female with medical history significant for hypertension, dementia, glaucoma, presented to the emergency department for chief concerns of fall 12/12/2020. Xray showed Periprosthetic fracture of the right greater trochanter with mild displacement. Per Dr. Joice Lofts, pt could be TTWB, PWB or WBAT if needed while mobilizing.    Clinical Impression  Pt alert, oriented to self and place only, family at bedside to help confirm PLOF. Pt at baseline is normally ambulatory with SPC, modI for ADLs, has meals on wheels. Denies any further falls, family assists with IADLs as able. Currently seeing outpatient PT.  The patient was able to to lift LEs against gravity, RLE with assistance only. Supine to sit with maxA and HOB elevated. Pt exhibited minimal pain signs/symptoms with mobility attempts. Sit <> Stand performed twice, maxAx1 with RW to complete. Pt educated and attempted TTWB, PWB, and WBAT during standing and transferring to the recliner; pt only able to perform WBAT. Pt reported fatigue after transferring to the recliner, further mobility attempts deferred.  Overall the patient demonstrated deficits (see "PT Problem List") that impede the patient's functional abilities, safety, and mobility and would benefit from skilled PT intervention. Recommendation is SNF due to decline in functional status and current level of assistance needed.     Follow Up Recommendations SNF    Equipment Recommendations  Other (comment) (TBD at next venue of care)    Recommendations for Other Services       Precautions / Restrictions Precautions Precautions: Fall Restrictions Weight Bearing Restrictions: Yes RLE Weight Bearing: Weight bearing as tolerated Other Position/Activity Restrictions: Per Dr. Joice Lofts, pt could be TTWB, PWB or WBAT if needed while  mobilizing.      Mobility  Bed Mobility Overal bed mobility: Needs Assistance Bed Mobility: Supine to Sit     Supine to sit: HOB elevated;Max assist     General bed mobility comments: pt able to initiate but ultimately maxA to complete    Transfers Overall transfer level: Needs assistance Equipment used: Rolling walker (2 wheeled) Transfers: Sit to/from Stand Sit to Stand: Max assist         General transfer comment: maxA from EOB and from recliner  Ambulation/Gait Ambulation/Gait assistance: Mod assist Gait Distance (Feet): 2 Feet Assistive device: Rolling walker (2 wheeled)       General Gait Details: step to gait pattern, pt unable to perform any weight bearing except for WBAT on RLE despite cueing/support  Stairs            Wheelchair Mobility    Modified Rankin (Stroke Patients Only)       Balance Overall balance assessment: Needs assistance Sitting-balance support: Feet supported Sitting balance-Leahy Scale: Good     Standing balance support: Bilateral upper extremity supported Standing balance-Leahy Scale: Poor Standing balance comment: able to progress to poor balance from zero with cueing, time and RW support                             Pertinent Vitals/Pain Pain Assessment: Faces Faces Pain Scale: Hurts a little bit Pain Location: R hip Pain Descriptors / Indicators: Grimacing;Guarding Pain Intervention(s): Limited activity within patient's tolerance;Monitored during session;Repositioned    Home Living Family/patient expects to be discharged to:: Private residence Living Arrangements: Alone Available Help at Discharge: Family Type of Home: [facility]  Access: Ramped entrance     Home Layout: One level Home Equipment: Walker - standard;Cane - single point Additional Comments: no other falls. had been seeing PT in outpatient    Prior Function Level of Independence: Needs assistance   Gait / Transfers Assistance  Needed: pt ambulatory with [facility]  ADL's / Homemaking Assistance Needed: has meals on wheels, daughters check in on patient. modI for ADLs        Hand Dominance        Extremity/Trunk Assessment   Upper Extremity Assessment Upper Extremity Assessment: RUE deficits/detail;LUE deficits/detail RUE Deficits / Details: unable to lift >70degrees LUE Deficits / Details: ROM WFLs, grossly 3+/5    Lower Extremity Assessment Lower Extremity Assessment: RLE deficits/detail;LLE deficits/detail RLE Deficits / Details: unable to lift against gravity without assist LLE Deficits / Details: able to move against gravity independently    Cervical / Trunk Assessment Cervical / Trunk Assessment: Normal  Communication   Communication: HOH  Cognition Arousal/Alertness: Awake/alert Behavior During Therapy: WFL for tasks assessed/performed Overall Cognitive Status: History of cognitive impairments - at baseline                                 General Comments: pt oriented to self and place able to recall some details of situation      General Comments      Exercises Other Exercises Other Exercises: ankle pumps x5, AAROM on RLE: knee flexion x5, hip abduction/adduction   Assessment/Plan    PT Assessment Patient needs continued PT services  PT Problem List Decreased strength;Decreased mobility;Decreased range of motion;Decreased activity tolerance;Decreased balance;Pain;Decreased knowledge of precautions;Decreased knowledge of use of DME       PT Treatment Interventions DME instruction;Therapeutic exercise;Gait training;Balance training;Neuromuscular re-education;Functional mobility training;Therapeutic activities;Patient/family education    PT Goals (Current goals can be found in the Care Plan section)  Acute Rehab PT Goals Patient Stated Goal: to get up and move PT Goal Formulation: With patient Time For Goal Achievement: 12/28/20 Potential to Achieve Goals: Good     Frequency 7X/week   Barriers to discharge        Co-evaluation               AM-PAC PT "6 Clicks" Mobility  Outcome Measure Help needed turning from your back to your side while in a flat bed without using bedrails?: A Lot Help needed moving from lying on your back to sitting on the side of a flat bed without using bedrails?: A Lot Help needed moving to and from a bed to a chair (including a wheelchair)?: Total Help needed standing up from a chair using your arms (e.g., wheelchair or bedside chair)?: Total Help needed to walk in hospital room?: Total Help needed climbing 3-5 steps with a railing? : Total 6 Click Score: 8    End of Session Equipment Utilized During Treatment: Gait belt Activity Tolerance: Patient limited by fatigue Patient left: in chair;with chair alarm set;with call bell/phone within reach Nurse Communication: Mobility status PT Visit Diagnosis: Other abnormalities of gait and mobility (R26.89);Muscle weakness (generalized) (M62.81);Difficulty in walking, not elsewhere classified (R26.2);Pain Pain - Right/Left: Right Pain - part of body: Hip    Time: 8938-1017 PT Time Calculation (min) (ACUTE ONLY): 27 min   Charges:   PT Evaluation $PT Eval Low Complexity: 1 Low PT Treatments $Therapeutic Activity: 23-37 mins       Olga Coaster PT, DPT 3:02 PM,12/14/20

## 2020-12-14 NOTE — Consult Note (Signed)
ORTHOPAEDIC CONSULTATION  REQUESTING PHYSICIAN: [name], MD  Chief Complaint:   Right hip pain.  History of Present Illness: Cindy Mcdonald is a 87 y.o. female with multiple medical problems including peripheral vascular disease, hyperlipidemia, and hypertension who lives independently with her cat and dog.  The patient was in her usual state of health when she apparently lost her balance when she went to get up out of bed this morning and fell onto the floor.  She was found by her daughter this morning and brought to the emergency room where x-rays demonstrated an essentially nondisplaced fracture of the right greater trochanter.  The patient is status post a prior right hip hemiarthroplasty.  The patient did strike her head as evidenced by a contusion on the right forehead, but does not feel that she lost consciousness.  A CT scan of her head in the emergency room showed no acute intracranial abnormality.  The patient does not recall any lightheadedness, dizziness, chest pain, shortness of breath, or other symptoms may have precipitated her fall.  Past Medical History:  Diagnosis Date  . Arthritis   . Carotid artery occlusion   . Carotid artery occlusion   . Complication of anesthesia    difficulty waking up for 3 days after back surgery  . Hyperlipidemia   . Hypertension    Past Surgical History:  Procedure Laterality Date  . BACK SURGERY    . CAROTID STENT    . CATARACT EXTRACTION, BILATERAL Bilateral   . ENDARTERECTOMY Left 05/02/2018   Procedure: ENDARTERECTOMY CAROTID;  Surgeon: Renford Dills, MD;  Location: [facility] ORS;  Service: Vascular;  Laterality: Left;  . JOINT REPLACEMENT    . REPLACEMENT TOTAL HIP W/  RESURFACING IMPLANTS     times two  . TONSILLECTOMY    . TOTAL SHOULDER REPLACEMENT Right    Social History   Socioeconomic History  . Marital status: Widowed    Spouse name: Not on file   . Number of children: Not on file  . Years of education: Not on file  . Highest education level: Not on file  Occupational History  . Not on file  Tobacco Use  . Smoking status: Never Smoker  . Smokeless tobacco: Never Used  Vaping Use  . Vaping Use: Never used  Substance and Sexual Activity  . Alcohol use: Not Currently  . Drug use: Never  . Sexual activity: Not on file  Other Topics Concern  . Not on file  Social History Narrative  . Not on file   Social Determinants of Health   Financial Resource Strain: Not on file  Food Insecurity: Not on file  Transportation Needs: Not on file  Physical Activity: Not on file  Stress: Not on file  Social Connections: Not on file   Family History  Problem Relation Age of Onset  . Hypertension Mother   . Heart disease Father    Allergies  Allergen Reactions  . Codeine     Unknown  . Nsaids     Dizziness, tears up stomach  . Statins     Leg pain    Prior to Admission medications   Medication Sig Start Date End Date Taking? Authorizing Provider  acetaminophen (TYLENOL) 500 MG tablet Take 500 mg by mouth 2 (two) times daily as needed for moderate pain or headache.    [provider]  amLODipine (NORVASC) 5 MG tablet Take 5 mg by mouth once daily 02/23/18   [provider]  aspirin  EC 81 MG tablet Take 81 mg by mouth daily.     [provider]  Biotin 5000 MCG CAPS Take 5,000 mcg by mouth daily.    [provider]  Calcium-Vitamin D-Vitamin K (VIACTIV CALCIUM PLUS D PO) Take 1-2 Doses by mouth 2 (two) times daily. 2 viactiv in the morning and 1 at night    [provider]  dorzolamide-timolol (COSOPT) 22.3-6.8 MG/ML ophthalmic solution INSTILL 1 DROP TO BOTH EYES 2 TIMES A DAY 04/21/16   [provider]  GARLIC PO Take 1 tablet by mouth daily.    [provider]  latanoprost (XALATAN) 0.005 % ophthalmic solution Place 1 drop into both eyes at bedtime.  01/15/18   [provider]  losartan (COZAAR) 25 MG tablet Take 25 mg by mouth at bedtime.  03/27/16   [provider]  niacin 500 MG tablet Take 500 mg by mouth daily.     [provider]  Omega-3 Fatty Acids (FISH OIL) 1000 MG CPDR Take 1,000-2,000 mg by mouth See admin instructions. Take 2000 mg in the morning and 1000 mg at night    [provider]   CT Head Wo Contrast  Result Date: 12/13/2020 CLINICAL DATA:  Fall, found down, facial injury EXAM: CT HEAD WITHOUT CONTRAST CT MAXILLOFACIAL WITHOUT CONTRAST CT CERVICAL SPINE WITHOUT CONTRAST TECHNIQUE: Multidetector CT imaging of the head, cervical spine, and maxillofacial structures were performed using the standard protocol without intravenous contrast. Multiplanar CT image reconstructions of the cervical spine and maxillofacial structures were also generated. COMPARISON:  CT head neck angiogram, 03/22/2018 FINDINGS: CT HEAD FINDINGS Brain: No evidence of acute infarction, hemorrhage, hydrocephalus, extra-axial collection or mass lesion/mass effect. Mild periventricular and deep white matter hypodensity. Vascular: No hyperdense vessel or unexpected calcification. CT FACIAL BONES FINDINGS Skull: Normal. Negative for fracture or focal lesion. Facial bones: No displaced fractures or dislocations. Sinuses/Orbits: No acute finding. There is partially calcified opacification of the right maxillary sinus with bony thickening and erosion of the posterior sinus wall (series 3, image 33). There is likewise partially calcified opacification of the right sphenoid sinus with bony thickening and erosion of the posterior sinus wall adjoining the middle cranial fossa (series 3, image 27). Sinus opacification is as seen on prior examination dated 03/22/2018 but bony erosion is new. Other: Soft tissue contusion of the right forehead. CT CERVICAL SPINE FINDINGS Alignment: Normal. Skull base and vertebrae: No acute fracture. No primary bone lesion or focal  pathologic process. Soft tissues and spinal canal: No prevertebral fluid or swelling. No visible canal hematoma. Disc levels: Moderate multilevel disc space height loss and osteophytosis. Upper chest: Negative. Other: None. IMPRESSION: 1. No acute intracranial pathology. Small-vessel white matter disease. 2. No displaced fracture or dislocation of the facial bones. 3. There is partially calcified opacification of the right maxillary sinus with bony thickening and erosion of the posterior sinus wall. There is likewise partially calcified opacification of the right sphenoid sinus with bony thickening and erosion of the posterior sinus wall adjoining the middle cranial fossa. Sinus opacification is as seen on prior examination dated 03/22/2018 but bony erosion is new. Findings are consistent with chronic sinusitis, potentially fungal given the presence of bony erosion. 4. No fracture or static subluxation of the cervical spine. Moderate multilevel disc space height loss and osteophytosis. Electronically Signed   By: Lauralyn Primes M.D.   On: 12/13/2020 16:01   CT Cervical Spine Wo Contrast  Result Date: 12/13/2020 CLINICAL DATA:  Fall,  found down, facial injury EXAM: CT HEAD WITHOUT CONTRAST CT MAXILLOFACIAL WITHOUT CONTRAST CT CERVICAL SPINE WITHOUT CONTRAST TECHNIQUE: Multidetector CT imaging of the head, cervical spine, and maxillofacial structures were performed using the standard protocol without intravenous contrast. Multiplanar CT image reconstructions of the cervical spine and maxillofacial structures were also generated. COMPARISON:  CT head neck angiogram, 03/22/2018 FINDINGS: CT HEAD FINDINGS Brain: No evidence of acute infarction, hemorrhage, hydrocephalus, extra-axial collection or mass lesion/mass effect. Mild periventricular and deep white matter hypodensity. Vascular: No hyperdense vessel or unexpected calcification. CT FACIAL BONES FINDINGS Skull: Normal. Negative for fracture or focal lesion. Facial  bones: No displaced fractures or dislocations. Sinuses/Orbits: No acute finding. There is partially calcified opacification of the right maxillary sinus with bony thickening and erosion of the posterior sinus wall (series 3, image 33). There is likewise partially calcified opacification of the right sphenoid sinus with bony thickening and erosion of the posterior sinus wall adjoining the middle cranial fossa (series 3, image 27). Sinus opacification is as seen on prior examination dated 03/22/2018 but bony erosion is new. Other: Soft tissue contusion of the right forehead. CT CERVICAL SPINE FINDINGS Alignment: Normal. Skull base and vertebrae: No acute fracture. No primary bone lesion or focal pathologic process. Soft tissues and spinal canal: No prevertebral fluid or swelling. No visible canal hematoma. Disc levels: Moderate multilevel disc space height loss and osteophytosis. Upper chest: Negative. Other: None. IMPRESSION: 1. No acute intracranial pathology. Small-vessel white matter disease. 2. No displaced fracture or dislocation of the facial bones. 3. There is partially calcified opacification of the right maxillary sinus with bony thickening and erosion of the posterior sinus wall. There is likewise partially calcified opacification of the right sphenoid sinus with bony thickening and erosion of the posterior sinus wall adjoining the middle cranial fossa. Sinus opacification is as seen on prior examination dated 03/22/2018 but bony erosion is new. Findings are consistent with chronic sinusitis, potentially fungal given the presence of bony erosion. 4. No fracture or static subluxation of the cervical spine. Moderate multilevel disc space height loss and osteophytosis. Electronically Signed   By: Lauralyn Primes M.D.   On: 12/13/2020 16:01   Chest Portable 1 View  Result Date: 12/13/2020 CLINICAL DATA:  Fall EXAM: PORTABLE CHEST 1 VIEW COMPARISON:  04/19/2011 FINDINGS: The heart size and mediastinal contours  are within normal limits. Both lungs are clear. The visualized skeletal structures are unremarkable. IMPRESSION: No active disease. Electronically Signed   By: Deatra Robinson M.D.   On: 12/13/2020 19:38   DG Femur Min 2 Views Right  Result Date: 12/13/2020 CLINICAL DATA:  Pain with standing status post fall. EXAM: RIGHT FEMUR 2 VIEWS COMPARISON:  Pelvic x-ray from March 16, 2020. FINDINGS: Post RIGHT total hip arthroplasty. Periprosthetic fracture involving the greater trochanter with mild displacement. No additional fractures about the femur. Soft tissues are unremarkable. IMPRESSION: Periprosthetic fracture involving the RIGHT greater trochanter with mild displacement. Electronically Signed   By: Donzetta Kohut M.D.   On: 12/13/2020 16:24   CT Maxillofacial Wo Contrast  Result Date: 12/13/2020 CLINICAL DATA:  Fall, found down, facial injury EXAM: CT HEAD WITHOUT CONTRAST CT MAXILLOFACIAL WITHOUT CONTRAST CT CERVICAL SPINE WITHOUT CONTRAST TECHNIQUE: Multidetector CT imaging of the head, cervical spine, and maxillofacial structures were performed using the standard protocol without intravenous contrast. Multiplanar CT image reconstructions of the cervical spine and maxillofacial structures were also generated. COMPARISON:  CT head neck angiogram, 03/22/2018 FINDINGS: CT HEAD FINDINGS Brain: No evidence of acute  infarction, hemorrhage, hydrocephalus, extra-axial collection or mass lesion/mass effect. Mild periventricular and deep white matter hypodensity. Vascular: No hyperdense vessel or unexpected calcification. CT FACIAL BONES FINDINGS Skull: Normal. Negative for fracture or focal lesion. Facial bones: No displaced fractures or dislocations. Sinuses/Orbits: No acute finding. There is partially calcified opacification of the right maxillary sinus with bony thickening and erosion of the posterior sinus wall (series 3, image 33). There is likewise partially calcified opacification of the right sphenoid sinus  with bony thickening and erosion of the posterior sinus wall adjoining the middle cranial fossa (series 3, image 27). Sinus opacification is as seen on prior examination dated 03/22/2018 but bony erosion is new. Other: Soft tissue contusion of the right forehead. CT CERVICAL SPINE FINDINGS Alignment: Normal. Skull base and vertebrae: No acute fracture. No primary bone lesion or focal pathologic process. Soft tissues and spinal canal: No prevertebral fluid or swelling. No visible canal hematoma. Disc levels: Moderate multilevel disc space height loss and osteophytosis. Upper chest: Negative. Other: None. IMPRESSION: 1. No acute intracranial pathology. Small-vessel white matter disease. 2. No displaced fracture or dislocation of the facial bones. 3. There is partially calcified opacification of the right maxillary sinus with bony thickening and erosion of the posterior sinus wall. There is likewise partially calcified opacification of the right sphenoid sinus with bony thickening and erosion of the posterior sinus wall adjoining the middle cranial fossa. Sinus opacification is as seen on prior examination dated 03/22/2018 but bony erosion is new. Findings are consistent with chronic sinusitis, potentially fungal given the presence of bony erosion. 4. No fracture or static subluxation of the cervical spine. Moderate multilevel disc space height loss and osteophytosis. Electronically Signed   By: Lauralyn PrimesAlex  Bibbey M.D.   On: 12/13/2020 16:01    Positive ROS: All other systems have been reviewed and were otherwise negative with the exception of those mentioned in the HPI and as above.  Physical Exam: General:  Alert, no acute distress Psychiatric:  Patient is competent for consent with normal mood and affect   Cardiovascular:  No pedal edema Respiratory:  No wheezing, non-labored breathing GI:  Abdomen is soft and non-tender Skin:  No lesions in the area of chief complaint Neurologic:  Sensation intact  distally Lymphatic:  No axillary or cervical lymphadenopathy  Orthopedic Exam:  Orthopedic examination is limited to the right hip and lower extremity.  The right lower extremity is symmetrically aligned and oriented as compared to the left.  Skin inspection around the right hip is notable for well-healed surgical incision, but otherwise is unremarkable.  There is perhaps mild swelling around the hip, but no erythema, ecchymosis, abrasions, or other skin abnormalities are identified.  She has most minimal tenderness palpation over the lateral aspect of the right hip.  She is able to actively flex her hip with only mild discomfort.  She is neurovascularly intact to the right lower extremity and foot.  X-rays:  Recent x-rays of the pelvis and right hip are available for review and have been reviewed by myself.  These films demonstrate what appears to be a minimally displaced isolated right greater trochanteric fracture.  There is a well-positioned hemiarthroplasty in place which shows no evidence for loosening.  The femoral head is concentrically located within the acetabulum.  No lytic lesions or other acute bony abnormalities are identified.  Assessment: Essentially nondisplaced right greater trochanteric fracture status post prior right hip hemiarthroplasty.  Plan: The treatment options have been discussed with the patient and her daughter,  who is at the bedside.  I do not feel that this injury requires any surgical intervention and can be managed nonsurgically.  The patient may begin to be mobilized, weightbearing as tolerated on the right lower extremity with a walker and assistance.  Pain medication may be prescribed as deemed appropriate.  Most likely this patient will require skilled nursing upon discharge for at least a few weeks.  Thank you for asking me to participate in the care of this delightful woman.  I will be happy to follow her with you.   Maryagnes Amos, MD  Beeper #:  [phone]  12/14/2020 3:01 PM

## 2020-12-14 NOTE — Progress Notes (Addendum)
PROGRESS NOTE    Cindy Mcdonald  PFX:[id number] DOB: [birth year] DOA: 12/13/2020 PCP: Jaclyn Shaggy, MD    Assessment & Plan:   Principal Problem:   Hip fracture [facility]) Active Problems:   Essential hypertension   CAD (coronary artery disease)   Hyperlipidemia   Carotid stenosis, asymptomatic, left   Protein malnutrition (HCC)   Periprosthetic fracture of the right greater trochanter: with mild displacement as per XR. No surgery needed currently as per ortho surg. Norco, morphine prn for pain. PT/OT consulted.  Possible bony erosion: likely chronic sinusitis but concerning for fungal infection: ENT was consulted.   Advanced dementia: continue on home dose of memantine, donepezil. Continue w/ supportive care   Protein malnutrition: likely severe. Nutrition was consulted  Diffuse ecchymosis: of the face and arms. Secondary to fall & present on admission  Normocytic anemia: no need for a transfusion currently   HTN: continue on home dose of losartan, amlodipine.   HLD: continue on statin  Glaucoma: continue on home dose of dorzolamine, timolol.    DVT prophylaxis: lovenox  Code Status: full Family Communication: discussed pt's care w/ pt's daughter at bedside and answered her questions Disposition Plan: depends on PT/OT recs   Level of care: Med-Surg   Status is: Observation  The patient remains OBS appropriate and will d/c before 2 midnights.  Dispo: The patient is from: Home              Anticipated d/c is to: SNF vs home health               Patient currently is not medically stable to d/c.   Difficult to place patient No    Consultants:   Ortho surg    Procedures:    Antimicrobials:    Subjective: Pt c/o fatigue   Objective: Vitals:   12/13/20 2341 12/14/20 0432 12/14/20 0816 12/14/20 1121  BP: 117/70 116/72 (!) 118/103 94/61  Pulse: 83 83 78 79  Resp: 15 15  15   Temp: 98.7 F (37.1 C) 98.4 F (36.9 C) 98.9 F (37.2 C) 99.1 F (37.3 C)   TempSrc: Oral Oral    SpO2: 99% 97%  96%  Weight:      Height:        Intake/Output Summary (Last 24 hours) at 12/14/2020 1305 Last data filed at 12/14/2020 1104 Gross per 24 hour  Intake -  Output 800 ml  Net -800 ml   Filed Weights   12/13/20 1519  Weight: 47.6 kg    Examination:  General exam: Appears calm and comfortable  Respiratory system: Clear to auscultation. Respiratory effort normal. Cardiovascular system: S1 & S2 +. No rubs, gallops or clicks. Gastrointestinal system: Abdomen is nondistended, soft and nontender.  Normal bowel sounds heard. Central nervous system: Alert and awake. Moves all extremities  Psychiatry: Judgement and insight appear abnormal. Flat mood and affect     Data Reviewed: I have personally reviewed following labs and imaging studies  CBC: Recent Labs  Lab 12/13/20 1527 12/14/20 0928  WBC 15.0* 10.4  NEUTROABS 12.8*  --   HGB 10.6* 9.5*  HCT 32.4* 30.0*  MCV 88.5 90.9  PLT 279 213   Basic Metabolic Panel: Recent Labs  Lab 12/13/20 1527 12/14/20 0928  NA 132* 134*  K 4.3 4.3  CL 98 100  CO2 25 26  GLUCOSE 147* 102*  BUN 30* 23  CREATININE 1.03* 1.07*  CALCIUM 9.1 8.8*   GFR: Estimated Creatinine Clearance: 27.3 mL/min (A) (  by C-G formula based on SCr of 1.07 mg/dL (H)). Liver Function Tests: Recent Labs  Lab 12/13/20 1527  AST 30  ALT 15  ALKPHOS 60  BILITOT 0.7  PROT 6.6  ALBUMIN 3.5   No results for input(s): LIPASE, AMYLASE in the last 168 hours. No results for input(s): AMMONIA in the last 168 hours. Coagulation Profile: No results for input(s): INR, PROTIME in the last 168 hours. Cardiac Enzymes: Recent Labs  Lab 12/13/20 1527  CKTOTAL 184   BNP (last 3 results) No results for input(s): PROBNP in the last 8760 hours. HbA1C: No results for input(s): HGBA1C in the last 72 hours. CBG: No results for input(s): GLUCAP in the last 168 hours. Lipid Profile: No results for input(s): CHOL, HDL, LDLCALC,  TRIG, CHOLHDL, LDLDIRECT in the last 72 hours. Thyroid Function Tests: No results for input(s): TSH, T4TOTAL, FREET4, T3FREE, THYROIDAB in the last 72 hours. Anemia Panel: No results for input(s): VITAMINB12, FOLATE, FERRITIN, TIBC, IRON, RETICCTPCT in the last 72 hours. Sepsis Labs: No results for input(s): PROCALCITON, LATICACIDVEN in the last 168 hours.  Recent Results (from the past 240 hour(s))  SARS CORONAVIRUS 2 (TAT 6-24 HRS) Nasopharyngeal Nasopharyngeal Swab     Status: None   Collection Time: 12/13/20  5:30 PM   Specimen: Nasopharyngeal Swab  Result Value Ref Range Status   SARS Coronavirus 2 NEGATIVE NEGATIVE Final    Comment: (NOTE) SARS-CoV-2 target nucleic acids are NOT DETECTED.  The SARS-CoV-2 RNA is generally detectable in upper and lower respiratory specimens during the acute phase of infection. Negative results do not preclude SARS-CoV-2 infection, do not rule out co-infections with other pathogens, and should not be used as the sole basis for treatment or other patient management decisions. Negative results must be combined with clinical observations, patient history, and epidemiological information. The expected result is Negative.  Fact Sheet for Patients: HairSlick.no  Fact Sheet for Healthcare Providers: quierodirigir.com  This test is not yet approved or cleared by the [place] FDA and  has been authorized for detection and/or diagnosis of SARS-CoV-2 by FDA under an Emergency Use Authorization (EUA). This EUA will remain  in effect (meaning this test can be used) for the duration of the COVID-19 declaration under Se ction 564(b)(1) of the Act, 21 U.S.C. section 360bbb-3(b)(1), unless the authorization is terminated or revoked sooner.  Performed at [facility], [address], [place], [place] [address]          Radiology Studies: CT Head Wo Contrast  Result Date:  12/13/2020 CLINICAL DATA:  Fall, found down, facial injury EXAM: CT HEAD WITHOUT CONTRAST CT MAXILLOFACIAL WITHOUT CONTRAST CT CERVICAL SPINE WITHOUT CONTRAST TECHNIQUE: Multidetector CT imaging of the head, cervical spine, and maxillofacial structures were performed using the standard protocol without intravenous contrast. Multiplanar CT image reconstructions of the cervical spine and maxillofacial structures were also generated. COMPARISON:  CT head neck angiogram, 03/22/2018 FINDINGS: CT HEAD FINDINGS Brain: No evidence of acute infarction, hemorrhage, hydrocephalus, extra-axial collection or mass lesion/mass effect. Mild periventricular and deep white matter hypodensity. Vascular: No hyperdense vessel or unexpected calcification. CT FACIAL BONES FINDINGS Skull: Normal. Negative for fracture or focal lesion. Facial bones: No displaced fractures or dislocations. Sinuses/Orbits: No acute finding. There is partially calcified opacification of the right maxillary sinus with bony thickening and erosion of the posterior sinus wall (series 3, image 33). There is likewise partially calcified opacification of the right sphenoid sinus with bony thickening and erosion of the posterior sinus  wall adjoining the middle cranial fossa (series 3, image 27). Sinus opacification is as seen on prior examination dated 03/22/2018 but bony erosion is new. Other: Soft tissue contusion of the right forehead. CT CERVICAL SPINE FINDINGS Alignment: Normal. Skull base and vertebrae: No acute fracture. No primary bone lesion or focal pathologic process. Soft tissues and spinal canal: No prevertebral fluid or swelling. No visible canal hematoma. Disc levels: Moderate multilevel disc space height loss and osteophytosis. Upper chest: Negative. Other: None. IMPRESSION: 1. No acute intracranial pathology. Small-vessel white matter disease. 2. No displaced fracture or dislocation of the facial bones. 3. There is partially calcified opacification of  the right maxillary sinus with bony thickening and erosion of the posterior sinus wall. There is likewise partially calcified opacification of the right sphenoid sinus with bony thickening and erosion of the posterior sinus wall adjoining the middle cranial fossa. Sinus opacification is as seen on prior examination dated 03/22/2018 but bony erosion is new. Findings are consistent with chronic sinusitis, potentially fungal given the presence of bony erosion. 4. No fracture or static subluxation of the cervical spine. Moderate multilevel disc space height loss and osteophytosis. Electronically Signed   By: Lauralyn Primes M.D.   On: 12/13/2020 16:01   CT Cervical Spine Wo Contrast  Result Date: 12/13/2020 CLINICAL DATA:  Fall, found down, facial injury EXAM: CT HEAD WITHOUT CONTRAST CT MAXILLOFACIAL WITHOUT CONTRAST CT CERVICAL SPINE WITHOUT CONTRAST TECHNIQUE: Multidetector CT imaging of the head, cervical spine, and maxillofacial structures were performed using the standard protocol without intravenous contrast. Multiplanar CT image reconstructions of the cervical spine and maxillofacial structures were also generated. COMPARISON:  CT head neck angiogram, 03/22/2018 FINDINGS: CT HEAD FINDINGS Brain: No evidence of acute infarction, hemorrhage, hydrocephalus, extra-axial collection or mass lesion/mass effect. Mild periventricular and deep white matter hypodensity. Vascular: No hyperdense vessel or unexpected calcification. CT FACIAL BONES FINDINGS Skull: Normal. Negative for fracture or focal lesion. Facial bones: No displaced fractures or dislocations. Sinuses/Orbits: No acute finding. There is partially calcified opacification of the right maxillary sinus with bony thickening and erosion of the posterior sinus wall (series 3, image 33). There is likewise partially calcified opacification of the right sphenoid sinus with bony thickening and erosion of the posterior sinus wall adjoining the middle cranial fossa  (series 3, image 27). Sinus opacification is as seen on prior examination dated 03/22/2018 but bony erosion is new. Other: Soft tissue contusion of the right forehead. CT CERVICAL SPINE FINDINGS Alignment: Normal. Skull base and vertebrae: No acute fracture. No primary bone lesion or focal pathologic process. Soft tissues and spinal canal: No prevertebral fluid or swelling. No visible canal hematoma. Disc levels: Moderate multilevel disc space height loss and osteophytosis. Upper chest: Negative. Other: None. IMPRESSION: 1. No acute intracranial pathology. Small-vessel white matter disease. 2. No displaced fracture or dislocation of the facial bones. 3. There is partially calcified opacification of the right maxillary sinus with bony thickening and erosion of the posterior sinus wall. There is likewise partially calcified opacification of the right sphenoid sinus with bony thickening and erosion of the posterior sinus wall adjoining the middle cranial fossa. Sinus opacification is as seen on prior examination dated 03/22/2018 but bony erosion is new. Findings are consistent with chronic sinusitis, potentially fungal given the presence of bony erosion. 4. No fracture or static subluxation of the cervical spine. Moderate multilevel disc space height loss and osteophytosis. Electronically Signed   By: Lauralyn Primes M.D.   On: 12/13/2020 16:01  Chest Portable 1 View  Result Date: 12/13/2020 CLINICAL DATA:  Fall EXAM: PORTABLE CHEST 1 VIEW COMPARISON:  04/19/2011 FINDINGS: The heart size and mediastinal contours are within normal limits. Both lungs are clear. The visualized skeletal structures are unremarkable. IMPRESSION: No active disease. Electronically Signed   By: Deatra RobinsonKevin  Herman M.D.   On: 12/13/2020 19:38   DG Femur Min 2 Views Right  Result Date: 12/13/2020 CLINICAL DATA:  Pain with standing status post fall. EXAM: RIGHT FEMUR 2 VIEWS COMPARISON:  Pelvic x-ray from March 16, 2020. FINDINGS: Post RIGHT total  hip arthroplasty. Periprosthetic fracture involving the greater trochanter with mild displacement. No additional fractures about the femur. Soft tissues are unremarkable. IMPRESSION: Periprosthetic fracture involving the RIGHT greater trochanter with mild displacement. Electronically Signed   By: Donzetta KohutGeoffrey  Wile M.D.   On: 12/13/2020 16:24   CT Maxillofacial Wo Contrast  Result Date: 12/13/2020 CLINICAL DATA:  Fall, found down, facial injury EXAM: CT HEAD WITHOUT CONTRAST CT MAXILLOFACIAL WITHOUT CONTRAST CT CERVICAL SPINE WITHOUT CONTRAST TECHNIQUE: Multidetector CT imaging of the head, cervical spine, and maxillofacial structures were performed using the standard protocol without intravenous contrast. Multiplanar CT image reconstructions of the cervical spine and maxillofacial structures were also generated. COMPARISON:  CT head neck angiogram, 03/22/2018 FINDINGS: CT HEAD FINDINGS Brain: No evidence of acute infarction, hemorrhage, hydrocephalus, extra-axial collection or mass lesion/mass effect. Mild periventricular and deep white matter hypodensity. Vascular: No hyperdense vessel or unexpected calcification. CT FACIAL BONES FINDINGS Skull: Normal. Negative for fracture or focal lesion. Facial bones: No displaced fractures or dislocations. Sinuses/Orbits: No acute finding. There is partially calcified opacification of the right maxillary sinus with bony thickening and erosion of the posterior sinus wall (series 3, image 33). There is likewise partially calcified opacification of the right sphenoid sinus with bony thickening and erosion of the posterior sinus wall adjoining the middle cranial fossa (series 3, image 27). Sinus opacification is as seen on prior examination dated 03/22/2018 but bony erosion is new. Other: Soft tissue contusion of the right forehead. CT CERVICAL SPINE FINDINGS Alignment: Normal. Skull base and vertebrae: No acute fracture. No primary bone lesion or focal pathologic process. Soft  tissues and spinal canal: No prevertebral fluid or swelling. No visible canal hematoma. Disc levels: Moderate multilevel disc space height loss and osteophytosis. Upper chest: Negative. Other: None. IMPRESSION: 1. No acute intracranial pathology. Small-vessel white matter disease. 2. No displaced fracture or dislocation of the facial bones. 3. There is partially calcified opacification of the right maxillary sinus with bony thickening and erosion of the posterior sinus wall. There is likewise partially calcified opacification of the right sphenoid sinus with bony thickening and erosion of the posterior sinus wall adjoining the middle cranial fossa. Sinus opacification is as seen on prior examination dated 03/22/2018 but bony erosion is new. Findings are consistent with chronic sinusitis, potentially fungal given the presence of bony erosion. 4. No fracture or static subluxation of the cervical spine. Moderate multilevel disc space height loss and osteophytosis. Electronically Signed   By: Lauralyn PrimesAlex  Bibbey M.D.   On: 12/13/2020 16:01        Scheduled Meds: . amLODipine  5 mg Oral Daily  . donepezil  5 mg Oral QHS  . dorzolamide-timolol  1 drop Both Eyes BID  . enoxaparin (LOVENOX) injection  30 mg Subcutaneous Q24H  . latanoprost  1 drop Both Eyes QHS  . losartan  25 mg Oral Daily  . memantine  10 mg Oral BID  . pravastatin  40 mg Oral q1800   Continuous Infusions:   LOS: 0 days    Time spent: 33 mins    [name], MD [facility] Pager [phone]  If 7PM-7AM, please contact night-coverage 12/14/2020, 1:05 PM

## 2020-12-14 NOTE — Consult Note (Signed)
Cindy Mcdonald, [place] [id number] [birth year] Cindy Killian, MD  Reason for Consult: possible fungal sinusitis  HPI: 87 year old female with history of progressive dementia s/p fall.  On maxillofacial CT scan, patient was found to have possible chronic fungal sinusitis with bone erosion of posterior maxillary and sphenoid sinus on right.  Most of patient's history gleaned from chart and daughter as patient is unable to understand most of what was discussed.  Patient denies any sinus symptoms.  Daughter reports she has had a cough for last several months but they thought was related to water intake.  Prior CT from 2008 showed sinusitis that has progressed.  No recent obvious focal neurological deficits.  Allergies:  Allergies  Allergen Reactions  . Codeine     Unknown  . Nsaids     Dizziness, tears up stomach  . Statins     Leg pain     ROS: Review of systems normal other than 12 systems except per HPI.  PMH:  Past Medical History:  Diagnosis Date  . Arthritis   . Carotid artery occlusion   . Carotid artery occlusion   . Complication of anesthesia    difficulty waking up for 3 days after back surgery  . Hyperlipidemia   . Hypertension     FH:  Family History  Problem Relation Age of Onset  . Hypertension Mother   . Heart disease Father     SH:  Social History   Socioeconomic History  . Marital status: Widowed    Spouse name: Not on file  . Number of children: Not on file  . Years of education: Not on file  . Highest education level: Not on file  Occupational History  . Not on file  Tobacco Use  . Smoking status: Never Smoker  . Smokeless tobacco: Never Used  Vaping Use  . Vaping Use: Never used  Substance and Sexual Activity  . Alcohol use: Not Currently  . Drug use: Never  . Sexual activity: Not on file  Other Topics Concern  . Not on file  Social History Narrative  . Not on file   Social Determinants of Health   Financial Resource Strain: Not on  file  Food Insecurity: Not on file  Transportation Needs: Not on file  Physical Activity: Not on file  Stress: Not on file  Social Connections: Not on file  Intimate Partner Violence: Not on file    PSH:  Past Surgical History:  Procedure Laterality Date  . BACK SURGERY    . CAROTID STENT    . CATARACT EXTRACTION, BILATERAL Bilateral   . ENDARTERECTOMY Left 05/02/2018   Procedure: ENDARTERECTOMY CAROTID;  Surgeon: Renford Dills, MD;  Location: [facility] ORS;  Service: Vascular;  Laterality: Left;  . JOINT REPLACEMENT    . REPLACEMENT TOTAL HIP W/  RESURFACING IMPLANTS     times two  . TONSILLECTOMY    . TOTAL SHOULDER REPLACEMENT Right     Physical  Exam:  GEN-  Supine in bed in NAD, pleasantly demented NEURO- CN 2-12 grossly intact and symmetric. EARS- external ears clear NOSE- clear anteriorly OC/OP- no masses of lesions RESP- unlabored CARD-  Pink extermities  CT Maxillofacial-  Calcifications and opacification of right maxillary and right sphenoid with bone erosion of posterior wall.  Osteitic thickening of bone consistent with chronic fungal sinusitis/mycetoma.  A/P: Chronic right maxillary and sphenoid fungal sinusitis with bone erosion.  Discussed findings with patient and daughter.  No sinusitis symptoms but patient  also with dementia and may be unable to fully verbalize her symptoms.  Recommend proceeding with MRI maxillofacial for further evaluation of the extent of erosion and extension, especially of sphenoid sinus to determine if this is causing mass effect on her brain.  If surgical intervention is needed, this will need to be done at a tertiary center such as [facility] given posterior sphenoidal defect.  Patient's daughter understands and agrees with plan on obtaining MRI while inpatient here at [facility] but understanding that if any surgical intervention is done it will be done on a non-urgent basis at [facility].   Roney Mans Cindy Mcdonald 12/14/2020 2:03 PM

## 2020-12-15 DIAGNOSIS — E43 Unspecified severe protein-calorie malnutrition: Secondary | ICD-10-CM | POA: Diagnosis present

## 2020-12-15 DIAGNOSIS — R059 Cough, unspecified: Secondary | ICD-10-CM | POA: Diagnosis present

## 2020-12-15 DIAGNOSIS — Z7189 Other specified counseling: Secondary | ICD-10-CM | POA: Diagnosis not present

## 2020-12-15 DIAGNOSIS — R339 Retention of urine, unspecified: Secondary | ICD-10-CM | POA: Diagnosis present

## 2020-12-15 DIAGNOSIS — S72009A Fracture of unspecified part of neck of unspecified femur, initial encounter for closed fracture: Secondary | ICD-10-CM | POA: Diagnosis present

## 2020-12-15 DIAGNOSIS — Z20822 Contact with and (suspected) exposure to covid-19: Secondary | ICD-10-CM | POA: Diagnosis present

## 2020-12-15 DIAGNOSIS — F17210 Nicotine dependence, cigarettes, uncomplicated: Secondary | ICD-10-CM | POA: Diagnosis present

## 2020-12-15 DIAGNOSIS — S72001A Fracture of unspecified part of neck of right femur, initial encounter for closed fracture: Secondary | ICD-10-CM | POA: Diagnosis not present

## 2020-12-15 DIAGNOSIS — Z96641 Presence of right artificial hip joint: Secondary | ICD-10-CM | POA: Diagnosis present

## 2020-12-15 DIAGNOSIS — Z96611 Presence of right artificial shoulder joint: Secondary | ICD-10-CM | POA: Diagnosis present

## 2020-12-15 DIAGNOSIS — W010XXA Fall on same level from slipping, tripping and stumbling without subsequent striking against object, initial encounter: Secondary | ICD-10-CM | POA: Diagnosis present

## 2020-12-15 DIAGNOSIS — Z96649 Presence of unspecified artificial hip joint: Secondary | ICD-10-CM | POA: Insufficient documentation

## 2020-12-15 DIAGNOSIS — I2510 Atherosclerotic heart disease of native coronary artery without angina pectoris: Secondary | ICD-10-CM | POA: Diagnosis present

## 2020-12-15 DIAGNOSIS — N814 Uterovaginal prolapse, unspecified: Secondary | ICD-10-CM | POA: Diagnosis present

## 2020-12-15 DIAGNOSIS — E871 Hypo-osmolality and hyponatremia: Secondary | ICD-10-CM | POA: Diagnosis present

## 2020-12-15 DIAGNOSIS — Z66 Do not resuscitate: Secondary | ICD-10-CM | POA: Diagnosis present

## 2020-12-15 DIAGNOSIS — J329 Chronic sinusitis, unspecified: Secondary | ICD-10-CM | POA: Diagnosis present

## 2020-12-15 DIAGNOSIS — S40022A Contusion of left upper arm, initial encounter: Secondary | ICD-10-CM | POA: Diagnosis present

## 2020-12-15 DIAGNOSIS — S72114A Nondisplaced fracture of greater trochanter of right femur, initial encounter for closed fracture: Secondary | ICD-10-CM | POA: Diagnosis present

## 2020-12-15 DIAGNOSIS — I10 Essential (primary) hypertension: Secondary | ICD-10-CM | POA: Diagnosis present

## 2020-12-15 DIAGNOSIS — R5383 Other fatigue: Secondary | ICD-10-CM | POA: Diagnosis not present

## 2020-12-15 DIAGNOSIS — R338 Other retention of urine: Secondary | ICD-10-CM | POA: Diagnosis not present

## 2020-12-15 DIAGNOSIS — I959 Hypotension, unspecified: Secondary | ICD-10-CM | POA: Diagnosis present

## 2020-12-15 DIAGNOSIS — F0390 Unspecified dementia without behavioral disturbance: Secondary | ICD-10-CM | POA: Diagnosis present

## 2020-12-15 DIAGNOSIS — S40021A Contusion of right upper arm, initial encounter: Secondary | ICD-10-CM | POA: Diagnosis present

## 2020-12-15 DIAGNOSIS — E785 Hyperlipidemia, unspecified: Secondary | ICD-10-CM | POA: Diagnosis present

## 2020-12-15 DIAGNOSIS — Z96651 Presence of right artificial knee joint: Secondary | ICD-10-CM | POA: Diagnosis present

## 2020-12-15 DIAGNOSIS — S0083XA Contusion of other part of head, initial encounter: Secondary | ICD-10-CM | POA: Diagnosis present

## 2020-12-15 DIAGNOSIS — H409 Unspecified glaucoma: Secondary | ICD-10-CM | POA: Diagnosis present

## 2020-12-15 DIAGNOSIS — D649 Anemia, unspecified: Secondary | ICD-10-CM | POA: Diagnosis present

## 2020-12-15 DIAGNOSIS — I6522 Occlusion and stenosis of left carotid artery: Secondary | ICD-10-CM | POA: Diagnosis present

## 2020-12-15 LAB — BASIC METABOLIC PANEL
Anion gap: 7 (ref 5–15)
BUN: 27 mg/dL — ABNORMAL HIGH (ref 8–23)
CO2: 27 mmol/L (ref 22–32)
Calcium: 8.7 mg/dL — ABNORMAL LOW (ref 8.9–10.3)
Chloride: 99 mmol/L (ref 98–111)
Creatinine, Ser: 1.01 mg/dL — ABNORMAL HIGH (ref 0.44–1.00)
GFR, Estimated: 54 mL/min — ABNORMAL LOW (ref >60)
Glucose, Bld: 105 mg/dL — ABNORMAL HIGH (ref 70–99)
Potassium: 4.6 mmol/L (ref 3.5–5.1)
Sodium: 133 mmol/L — ABNORMAL LOW (ref 135–145)

## 2020-12-15 LAB — CBC
HCT: 27.9 % — ABNORMAL LOW (ref 36.0–46.0)
Hemoglobin: 9.1 g/dL — ABNORMAL LOW (ref 12.0–15.0)
MCH: 29.3 pg (ref 26.0–34.0)
MCHC: 32.6 g/dL (ref 30.0–36.0)
MCV: 89.7 fL (ref 80.0–100.0)
Platelets: 216 10*3/uL (ref 150–400)
RBC: 3.11 MIL/uL — ABNORMAL LOW (ref 3.87–5.11)
RDW: 14.1 % (ref 11.5–15.5)
WBC: 11.4 10*3/uL — ABNORMAL HIGH (ref 4.0–10.5)
nRBC: 0 % (ref 0.0–0.2)

## 2020-12-15 MED ORDER — SODIUM CHLORIDE 0.9 % BOLUS PEDS
500.0000 mL | Freq: Once | INTRAVENOUS | Status: AC
  Administered 2020-12-15: 500 mL via INTRAVENOUS

## 2020-12-15 MED ORDER — CHLORHEXIDINE GLUCONATE CLOTH 2 % EX PADS
6.0000 | MEDICATED_PAD | Freq: Every day | CUTANEOUS | Status: DC
  Administered 2020-12-16 – 2020-12-17 (×2): 6 via TOPICAL

## 2020-12-15 MED ORDER — SODIUM CHLORIDE 0.9 % IV SOLN: INTRAVENOUS | Status: DC

## 2020-12-15 NOTE — TOC Progression Note (Signed)
Transition of Care [facility]) - Progression Note    Patient Details  Name: Cindy Mcdonald MRN: [id number] Date of Birth: 01-18-1930  Transition of Care [facility]) CM/SW Contact  Barrie Dunker, RN Phone Number: 12/15/2020, 10:21 AM  Clinical Narrative:    Sherron Monday with [facility] , they accepted the request for a bed [facility] to requiet auth ref number [id number] Faxed Clinical notes to [phone]  Expected Discharge Plan: Skilled Nursing Facility Barriers to Discharge: Insurance Authorization,Continued Medical Work up  Expected Discharge Plan and Services Expected Discharge Plan: Skilled Nursing Facility     Post Acute Care Choice: Skilled Nursing Facility Living arrangements for the past 2 months: Single Family Home                                       Social Determinants of Health (SDOH) Interventions    Readmission Risk Interventions No flowsheet data found.

## 2020-12-15 NOTE — Plan of Care (Signed)

## 2020-12-15 NOTE — TOC Progression Note (Signed)
Transition of Care [facility]) - Progression Note    Patient Details  Name: Cindy Mcdonald MRN: [id number] Date of Birth: 23-May-1930  Transition of Care [facility]) CM/SW Contact  Barrie Dunker, RN Phone Number: 12/15/2020, 9:34 AM  Clinical Narrative:     Spoke with the patient's daughter Selena Batten at [phone] She has chosen [facility] Explained that insurance has to be approved still, I will keep in touch with her to let her know once approved Notified [facility] of the choice Expected Discharge Plan: Skilled Nursing Facility Barriers to Discharge: Insurance Authorization,Continued Medical Work up  Expected Discharge Plan and Services Expected Discharge Plan: Skilled Nursing Facility     Post Acute Care Choice: Skilled Nursing Facility Living arrangements for the past 2 months: Single Family Home                                       Social Determinants of Health (SDOH) Interventions    Readmission Risk Interventions No flowsheet data found.

## 2020-12-15 NOTE — Progress Notes (Addendum)
PROGRESS NOTE   HPI was taken from Dr. Sedalia Mutaox: Cindy FeinsteinMildred M Mcdonald is a 87 y.o. female with medical history significant for hypertension, dementia, glaucoma, presented to the emergency department for chief concerns of fall.  At bedside, Ms. Kight is able to tell me her full name, age of 87, identified her daughter, Cindy Mcdonald at bedside. She was not able to tell me the year and current president.  Patient denies shortness of breath, chest pain, cough, nausea, fever, dysuria, hematuria.  She does endorse generalized aches and pain at the right arm and tenderness at bruising sites.  Patient was not able to tell me how or when she fell.  Daughter believes that it likely happened approximately 7-8 pm 12/12/20 because patient was still not in her pajamas and in the process of getting ready for bed.  She was found on the floor awake and alert.  Unknown whether there is loss of consciousness.  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 She quit smoking many years ago, 1960s.  She smoked about 5 cigarettes per day.  Patient denies EtOH and recreational drug use.  Vaccinations. Fully vaccinated for COVID-19 including booster  Hospital Course from Dr. Mayford KnifeWilliams 3/14-3/15: Pt was found to have right periprosthetic fracture which did not require surgical intervention. PT/OT recs SNF. Pt is waiting on SNF placement. Episode of orthostatic while working with PT. Will start IVFs and repeat orthostatic this afternoon    Abbie SonsMildred M Bellino  ZOX:[id number] DOB: 10/04/1929 DOA: 12/13/2020 PCP: Jaclyn Shaggyate, Denny C, MD    Assessment & Plan:   Principal Problem:   Hip fracture [facility](HCC) Active Problems:   Essential hypertension   CAD (coronary artery disease)   Hyperlipidemia   Carotid stenosis, asymptomatic, left   Protein malnutrition (HCC)   Protein-calorie malnutrition, severe   Periprosthetic fracture of the right greater trochanter: w/ mild displacement as per XR. No surgery needed as per ortho surg .  Norco, morphine prn for pain. PT/OT recs SNF  Chronic sinusitis: w/ possible bone erosion. MRI face shows disease is favored to represent chronic fungal sinusitis, w/ bone erosion of the posterolateral wall of the right maxillary sinus into the posterior sphenoid sinus wall/clivus. No significant inflammatory reaction. Further management as per ENT. ENT following and recs apprec   Advanced dementia: continue w/ supportive care. Continue on home dose of donepezil, memantine   Severe protein malnutrition: continue w/ nutritional supplementation   Diffuse ecchymosis: of the face and arms. Secondary to fall & present on admission  Normocytic anemia: H&H are labile. No need for a transfusion currently   HTN: continue on home dose of amlodipine, losartan. Hold for MAP<65. Episode of orthostatic while working with PT. Will start IVFs and repeat orthostatic this afternoon   HLD: continue on statin   Glaucoma: continue on timolol, dorzolamine   Hyponatremia: labile. Will continue to monitor     DVT prophylaxis: lovenox  Code Status: full Family Communication: discussed pt's care w/ pt's daughter at bedside and answered her questions Disposition Plan: PT/OT recs SNF. Waiting on SNF placement. Can d/c to SNF tomorrow if pt no longer orthostatic    Level of care: Med-Surg   Status is: Observation  The patient remains OBS appropriate and will d/c before 2 midnights.  Dispo: The patient is from: Home              Anticipated d/c is to: SNF               Patient  currently is not medically stable to d/c.   Difficult to place patient No    Consultants:   Ortho surg    Procedures:    Antimicrobials:    Subjective: Pt c/o malaise    Objective: Vitals:   12/14/20 1604 12/14/20 2019 12/14/20 2355 12/15/20 0515  BP: 104/71 (!) 88/54 98/66 101/82  Pulse: 86 88 95 90  Resp: 14 20 20 18   Temp: 98.4 F (36.9 C) 98.1 F (36.7 C) 98 F (36.7 C) 97.7 F (36.5 C)  TempSrc: Oral  Oral Oral Oral  SpO2: 98% 96% 97% 96%  Weight:      Height:        Intake/Output Summary (Last 24 hours) at 12/15/2020 0807 Last data filed at 12/15/2020 0645 Gross per 24 hour  Intake 120 ml  Output 1950 ml  Net -1830 ml   Filed Weights   12/13/20 1519  Weight: 47.6 kg    Examination:  General exam: Appears comfortable  Respiratory system: clear breath sounds b/l  Cardiovascular system: S1/S2+. No rubs or gallops  Gastrointestinal system: Abd is soft, NT, ND & hypoactive bowel sounds Central nervous system: Alert and awake. Moves all extremities  Psychiatry: Judgement and insight appear abnormal. Flat mood and affect     Data Reviewed: I have personally reviewed following labs and imaging studies  CBC: Recent Labs  Lab 12/13/20 1527 12/14/20 0928 12/15/20 0459  WBC 15.0* 10.4 11.4*  NEUTROABS 12.8*  --   --   HGB 10.6* 9.5* 9.1*  HCT 32.4* 30.0* 27.9*  MCV 88.5 90.9 89.7  PLT 279 213 216   Basic Metabolic Panel: Recent Labs  Lab 12/13/20 1527 12/14/20 0928 12/15/20 0459  NA 132* 134* 133*  K 4.3 4.3 4.6  CL 98 100 99  CO2 25 26 27   GLUCOSE 147* 102* 105*  BUN 30* 23 27*  CREATININE 1.03* 1.07* 1.01*  CALCIUM 9.1 8.8* 8.7*   GFR: Estimated Creatinine Clearance: 28.9 mL/min (A) (by C-G formula based on SCr of 1.01 mg/dL (H)). Liver Function Tests: Recent Labs  Lab 12/13/20 1527  AST 30  ALT 15  ALKPHOS 60  BILITOT 0.7  PROT 6.6  ALBUMIN 3.5   No results for input(s): LIPASE, AMYLASE in the last 168 hours. No results for input(s): AMMONIA in the last 168 hours. Coagulation Profile: No results for input(s): INR, PROTIME in the last 168 hours. Cardiac Enzymes: Recent Labs  Lab 12/13/20 1527  CKTOTAL 184   BNP (last 3 results) No results for input(s): PROBNP in the last 8760 hours. HbA1C: No results for input(s): HGBA1C in the last 72 hours. CBG: No results for input(s): GLUCAP in the last 168 hours. Lipid Profile: No results for  input(s): CHOL, HDL, LDLCALC, TRIG, CHOLHDL, LDLDIRECT in the last 72 hours. Thyroid Function Tests: No results for input(s): TSH, T4TOTAL, FREET4, T3FREE, THYROIDAB in the last 72 hours. Anemia Panel: No results for input(s): VITAMINB12, FOLATE, FERRITIN, TIBC, IRON, RETICCTPCT in the last 72 hours. Sepsis Labs: No results for input(s): PROCALCITON, LATICACIDVEN in the last 168 hours.  Recent Results (from the past 240 hour(s))  SARS CORONAVIRUS 2 (TAT 6-24 HRS) Nasopharyngeal Nasopharyngeal Swab     Status: None   Collection Time: 12/13/20  5:30 PM   Specimen: Nasopharyngeal Swab  Result Value Ref Range Status   SARS Coronavirus 2 NEGATIVE NEGATIVE Final    Comment: (NOTE) SARS-CoV-2 target nucleic acids are NOT DETECTED.  The SARS-CoV-2 RNA is generally detectable in upper and  lower respiratory specimens during the acute phase of infection. Negative results do not preclude SARS-CoV-2 infection, do not rule out co-infections with other pathogens, and should not be used as the sole basis for treatment or other patient management decisions. Negative results must be combined with clinical observations, patient history, and epidemiological information. The expected result is Negative.  Fact Sheet for Patients: HairSlick.no  Fact Sheet for Healthcare Providers: quierodirigir.com  This test is not yet approved or cleared by the [place] FDA and  has been authorized for detection and/or diagnosis of SARS-CoV-2 by FDA under an Emergency Use Authorization (EUA). This EUA will remain  in effect (meaning this test can be used) for the duration of the COVID-19 declaration under Se ction 564(b)(1) of the Act, 21 U.S.C. section 360bbb-3(b)(1), unless the authorization is terminated or revoked sooner.  Performed at [facility], [address], [place], [place] [address]          Radiology Studies: CT Head Wo  Contrast  Result Date: 12/13/2020 CLINICAL DATA:  Fall, found down, facial injury EXAM: CT HEAD WITHOUT CONTRAST CT MAXILLOFACIAL WITHOUT CONTRAST CT CERVICAL SPINE WITHOUT CONTRAST TECHNIQUE: Multidetector CT imaging of the head, cervical spine, and maxillofacial structures were performed using the standard protocol without intravenous contrast. Multiplanar CT image reconstructions of the cervical spine and maxillofacial structures were also generated. COMPARISON:  CT head neck angiogram, 03/22/2018 FINDINGS: CT HEAD FINDINGS Brain: No evidence of acute infarction, hemorrhage, hydrocephalus, extra-axial collection or mass lesion/mass effect. Mild periventricular and deep white matter hypodensity. Vascular: No hyperdense vessel or unexpected calcification. CT FACIAL BONES FINDINGS Skull: Normal. Negative for fracture or focal lesion. Facial bones: No displaced fractures or dislocations. Sinuses/Orbits: No acute finding. There is partially calcified opacification of the right maxillary sinus with bony thickening and erosion of the posterior sinus wall (series 3, image 33). There is likewise partially calcified opacification of the right sphenoid sinus with bony thickening and erosion of the posterior sinus wall adjoining the middle cranial fossa (series 3, image 27). Sinus opacification is as seen on prior examination dated 03/22/2018 but bony erosion is new. Other: Soft tissue contusion of the right forehead. CT CERVICAL SPINE FINDINGS Alignment: Normal. Skull base and vertebrae: No acute fracture. No primary bone lesion or focal pathologic process. Soft tissues and spinal canal: No prevertebral fluid or swelling. No visible canal hematoma. Disc levels: Moderate multilevel disc space height loss and osteophytosis. Upper chest: Negative. Other: None. IMPRESSION: 1. No acute intracranial pathology. Small-vessel white matter disease. 2. No displaced fracture or dislocation of the facial bones. 3. There is partially  calcified opacification of the right maxillary sinus with bony thickening and erosion of the posterior sinus wall. There is likewise partially calcified opacification of the right sphenoid sinus with bony thickening and erosion of the posterior sinus wall adjoining the middle cranial fossa. Sinus opacification is as seen on prior examination dated 03/22/2018 but bony erosion is new. Findings are consistent with chronic sinusitis, potentially fungal given the presence of bony erosion. 4. No fracture or static subluxation of the cervical spine. Moderate multilevel disc space height loss and osteophytosis. Electronically Signed   By: Lauralyn Primes M.D.   On: 12/13/2020 16:01   CT Cervical Spine Wo Contrast  Result Date: 12/13/2020 CLINICAL DATA:  Fall, found down, facial injury EXAM: CT HEAD WITHOUT CONTRAST CT MAXILLOFACIAL WITHOUT CONTRAST CT CERVICAL SPINE WITHOUT CONTRAST TECHNIQUE: Multidetector CT imaging of the head, cervical spine, and maxillofacial structures were performed using  the standard protocol without intravenous contrast. Multiplanar CT image reconstructions of the cervical spine and maxillofacial structures were also generated. COMPARISON:  CT head neck angiogram, 03/22/2018 FINDINGS: CT HEAD FINDINGS Brain: No evidence of acute infarction, hemorrhage, hydrocephalus, extra-axial collection or mass lesion/mass effect. Mild periventricular and deep white matter hypodensity. Vascular: No hyperdense vessel or unexpected calcification. CT FACIAL BONES FINDINGS Skull: Normal. Negative for fracture or focal lesion. Facial bones: No displaced fractures or dislocations. Sinuses/Orbits: No acute finding. There is partially calcified opacification of the right maxillary sinus with bony thickening and erosion of the posterior sinus wall (series 3, image 33). There is likewise partially calcified opacification of the right sphenoid sinus with bony thickening and erosion of the posterior sinus wall adjoining  the middle cranial fossa (series 3, image 27). Sinus opacification is as seen on prior examination dated 03/22/2018 but bony erosion is new. Other: Soft tissue contusion of the right forehead. CT CERVICAL SPINE FINDINGS Alignment: Normal. Skull base and vertebrae: No acute fracture. No primary bone lesion or focal pathologic process. Soft tissues and spinal canal: No prevertebral fluid or swelling. No visible canal hematoma. Disc levels: Moderate multilevel disc space height loss and osteophytosis. Upper chest: Negative. Other: None. IMPRESSION: 1. No acute intracranial pathology. Small-vessel white matter disease. 2. No displaced fracture or dislocation of the facial bones. 3. There is partially calcified opacification of the right maxillary sinus with bony thickening and erosion of the posterior sinus wall. There is likewise partially calcified opacification of the right sphenoid sinus with bony thickening and erosion of the posterior sinus wall adjoining the middle cranial fossa. Sinus opacification is as seen on prior examination dated 03/22/2018 but bony erosion is new. Findings are consistent with chronic sinusitis, potentially fungal given the presence of bony erosion. 4. No fracture or static subluxation of the cervical spine. Moderate multilevel disc space height loss and osteophytosis. Electronically Signed   By: Lauralyn Primes M.D.   On: 12/13/2020 16:01   Chest Portable 1 View  Result Date: 12/13/2020 CLINICAL DATA:  Fall EXAM: PORTABLE CHEST 1 VIEW COMPARISON:  04/19/2011 FINDINGS: The heart size and mediastinal contours are within normal limits. Both lungs are clear. The visualized skeletal structures are unremarkable. IMPRESSION: No active disease. Electronically Signed   By: Deatra Robinson M.D.   On: 12/13/2020 19:38   MR FACE/TRIGEMINAL WO/W CM  Result Date: 12/14/2020 CLINICAL DATA:  Chronic or recurrent maxillary and sphenoid sinusitis with posterior bone erosion. Assess intracranial  involvement. Dementia. EXAM: MRI FACE TRIGEMINAL WITHOUT AND WITH CONTRAST TECHNIQUE: Multiplanar, multiecho pulse sequences of the face and surrounding structures, including thin slice imaging of the course of the Trigeminal Nerves, were obtained both before and after administration of intravenous contrast. CONTRAST:  54mL GADAVIST GADOBUTROL 1 MMOL/ML IV SOLN COMPARISON:  CT yesterday. FINDINGS: As seen, the brain shows generalized atrophy. There chronic small-vessel ischemic changes of the brainstem, cerebellum, thalami and hemispheric white matter. No sign of hydrocephalus. The brain was not studied in a complete fashion. There is no evidence of dural thickening or leptomeningeal enhancement. Specifically, where the sphenoid sinus disease has resulted in erosion of the clivus to the right of midline, the adjacent dura does not appear thickened or enhancing in there is no intracranial collection. Complete opacification of the right maxillary sinus and right division of the sphenoid sinus as shown by CT, with T2 signal loss that can be seen with either chronic fungal sinusitis or densely inspissated mucus. Chronic fungal sinusitis is favored in  this case. At the site of erosion of the posterolateral wall of the right maxillary sinus, material bulges into the infratemporal fossa/masticator space slightly, but does not appear to insight any significant inflammatory response. Nasopharynx and oropharynx appear normal. No primary bone lesion seen. Parotid glands and submandibular glands are normal. The globes are normal as visualized. IMPRESSION: Chronic sinus disease affecting the right maxillary and sphenoid sinus. Bone erosion of the posterolateral wall of the right maxillary sinus into the posterior sphenoid sinus wall/clivus. In both locations, there does not appear to be any significant inflammatory reaction. No evidence of dural thickening or enhancement. No sign intradural spread or acute pathologic finding. No  significant inflammatory change in the infratemporal fossa/masticator space. The sinus disease is favored to represent chronic fungal sinusitis, though chronic inspissated mucus could have similar appearance. Bone erosion would be less common in the absence of fungal disease. Electronically Signed   By: Paulina Fusi M.D.   On: 12/14/2020 17:18   DG Femur Min 2 Views Right  Result Date: 12/13/2020 CLINICAL DATA:  Pain with standing status post fall. EXAM: RIGHT FEMUR 2 VIEWS COMPARISON:  Pelvic x-ray from March 16, 2020. FINDINGS: Post RIGHT total hip arthroplasty. Periprosthetic fracture involving the greater trochanter with mild displacement. No additional fractures about the femur. Soft tissues are unremarkable. IMPRESSION: Periprosthetic fracture involving the RIGHT greater trochanter with mild displacement. Electronically Signed   By: Donzetta Kohut M.D.   On: 12/13/2020 16:24   CT Maxillofacial Wo Contrast  Result Date: 12/13/2020 CLINICAL DATA:  Fall, found down, facial injury EXAM: CT HEAD WITHOUT CONTRAST CT MAXILLOFACIAL WITHOUT CONTRAST CT CERVICAL SPINE WITHOUT CONTRAST TECHNIQUE: Multidetector CT imaging of the head, cervical spine, and maxillofacial structures were performed using the standard protocol without intravenous contrast. Multiplanar CT image reconstructions of the cervical spine and maxillofacial structures were also generated. COMPARISON:  CT head neck angiogram, 03/22/2018 FINDINGS: CT HEAD FINDINGS Brain: No evidence of acute infarction, hemorrhage, hydrocephalus, extra-axial collection or mass lesion/mass effect. Mild periventricular and deep white matter hypodensity. Vascular: No hyperdense vessel or unexpected calcification. CT FACIAL BONES FINDINGS Skull: Normal. Negative for fracture or focal lesion. Facial bones: No displaced fractures or dislocations. Sinuses/Orbits: No acute finding. There is partially calcified opacification of the right maxillary sinus with bony thickening  and erosion of the posterior sinus wall (series 3, image 33). There is likewise partially calcified opacification of the right sphenoid sinus with bony thickening and erosion of the posterior sinus wall adjoining the middle cranial fossa (series 3, image 27). Sinus opacification is as seen on prior examination dated 03/22/2018 but bony erosion is new. Other: Soft tissue contusion of the right forehead. CT CERVICAL SPINE FINDINGS Alignment: Normal. Skull base and vertebrae: No acute fracture. No primary bone lesion or focal pathologic process. Soft tissues and spinal canal: No prevertebral fluid or swelling. No visible canal hematoma. Disc levels: Moderate multilevel disc space height loss and osteophytosis. Upper chest: Negative. Other: None. IMPRESSION: 1. No acute intracranial pathology. Small-vessel white matter disease. 2. No displaced fracture or dislocation of the facial bones. 3. There is partially calcified opacification of the right maxillary sinus with bony thickening and erosion of the posterior sinus wall. There is likewise partially calcified opacification of the right sphenoid sinus with bony thickening and erosion of the posterior sinus wall adjoining the middle cranial fossa. Sinus opacification is as seen on prior examination dated 03/22/2018 but bony erosion is new. Findings are consistent with chronic sinusitis, potentially fungal given the  presence of bony erosion. 4. No fracture or static subluxation of the cervical spine. Moderate multilevel disc space height loss and osteophytosis. Electronically Signed   By: Lauralyn Primes M.D.   On: 12/13/2020 16:01        Scheduled Meds: . amLODipine  5 mg Oral Daily  . donepezil  5 mg Oral QHS  . dorzolamide-timolol  1 drop Both Eyes BID  . enoxaparin (LOVENOX) injection  30 mg Subcutaneous Q24H  . feeding supplement  237 mL Oral TID BM  . latanoprost  1 drop Both Eyes QHS  . losartan  25 mg Oral Daily  . memantine  10 mg Oral BID  .  multivitamin with minerals  1 tablet Oral Daily  . pravastatin  40 mg Oral q1800   Continuous Infusions:   LOS: 0 days    Time spent: 33 mins    [name], MD [facility] Pager [phone]  If 7PM-7AM, please contact night-coverage 12/15/2020, 8:07 AM

## 2020-12-15 NOTE — Progress Notes (Signed)
PT Cancellation Note  Patient Details Name: Cindy Mcdonald MRN: [id number] DOB: 23-Dec-1929   Treatment:     Pt had limited AM session due to orthostatic hypotensive episodes upon sitting up. MD (Poggi) arrived during and ordered bolus of fluids. Will return later this date and continue to progress pt per POC.   Rushie Chestnut 12/15/2020, 3:18 PM

## 2020-12-15 NOTE — TOC Progression Note (Signed)
Transition of Care [facility]) - Progression Note    Patient Details  Name: [name] MRN: [id number] Date of Birth: 1930-01-20  Transition of Care [facility] CM/SW Contact  Barrie Dunker, RN Phone Number: 12/15/2020, 2:18 PM  Clinical Narrative:    Received a call from [facility] health with Auth Approval ref number [id number] Approval from 3/15-3/17 Gabriel Rung is the case manager with [facility] Patient to DC tomorrow   Expected Discharge Plan: Skilled Nursing Facility Barriers to Discharge: Insurance Authorization,Continued Medical Work up  Expected Discharge Plan and Services Expected Discharge Plan: Skilled Nursing Facility     Post Acute Care Choice: Skilled Nursing Facility Living arrangements for the past 2 months: Single Family Home                                       Social Determinants of Health (SDOH) Interventions    Readmission Risk Interventions No flowsheet data found.

## 2020-12-15 NOTE — Progress Notes (Signed)
Reviewed CT and MRI scan with daughter and patient.  No inflammation of dura or signs of invasion.  Confirms more of a chronic process that has evolved since 2008.  Recommend outpatient evaluation by Dr. Konrad Dolores at [facility] for discussion of observation versus surgery down the road.  Given no inflammatory findings, this can most likely wait until after SNF given lack of any symptoms.  Daughter demonstrated understanding and my office will facilitate outpatient visit.

## 2020-12-15 NOTE — Progress Notes (Signed)
Physical Therapy Treatment Patient Details Name: Cindy Mcdonald MRN: [id number] DOB: 04/13/1930 Today's Date: 12/15/2020    History of Present Illness Pt is an 87 y.o. female with medical history significant for hypertension, dementia, glaucoma, presented to the emergency department for chief concerns of fall 12/12/2020. Xray showed Periprosthetic fracture of the right greater trochanter with mild displacement. Per Dr. Joice Lofts, no surgical intervention needed and WBAT with RW and assistance as needed    PT Comments    Pt was long sitting in bed with supportive daughter at bedside. She is A and cooperative but per daughter, has new onset of cognition deficits over past few months. Pt was able to follow commands consistently throughout session. Was limited by pain and fatigue but attempted all desired task. Pt had not voided in several days. She was able to exit R side of bed with mod assist but required max assist to return to supine from EOB short sit. No symptoms of orthostatic hypotension this afternoon. BP in sitting 117/78. She was able to tolerate standing and taking steps (~3 ft) to [facility] with max assist + constant protecting of RLE from buckling. She is a high fall risk. Will greatly benefit from SNF at DC to address deficits while improving safety during functional ADLs.   Follow Up Recommendations  SNF     Equipment Recommendations  Other (comment) (defer to next level of care)    Recommendations for Other Services       Precautions / Restrictions Precautions Precautions: Fall Restrictions Weight Bearing Restrictions: Yes RLE Weight Bearing: Weight bearing as tolerated    Mobility  Bed Mobility Overal bed mobility: Needs Assistance Bed Mobility: Supine to Sit;Sit to Supine     Supine to sit: Mod assist;HOB elevated Sit to supine: Max assist        Transfers Overall transfer level: Needs assistance Equipment used: Rolling walker (2 wheeled) Transfers: Sit to/from  Stand Sit to Stand: Mod assist;From elevated surface Stand pivot transfers: Max assist       General transfer comment: pt performed STS from elevated bed height with mod assist of one. Max assist to take ~ 3 steps to turn to [facility] then max assist to stand from lower BSC height and take ~ 3 steps/pivoting back to EOB.  Ambulation/Gait Ambulation/Gait assistance: Max assist Gait Distance (Feet): 3 Feet Assistive device: Rolling walker (2 wheeled) Gait Pattern/deviations: Antalgic Gait velocity: decreased   General Gait Details: pt required constant RLE protection from buckling dur9ing LLE advancement. Poor standing posture throughout.       Balance Overall balance assessment: Needs assistance Sitting-balance support: No upper extremity supported;Feet supported Sitting balance-Leahy Scale: Good     Standing balance support: Bilateral upper extremity supported;During functional activity Standing balance-Leahy Scale: Poor Standing balance comment: very high fall risk         Cognition Arousal/Alertness: Awake/alert Behavior During Therapy: WFL for tasks assessed/performed;Flat affect Overall Cognitive Status: History of cognitive impairments - at baseline    General Comments: Pt is A and cooperative however has baseline cognition deficits.             Pertinent Vitals/Pain Pain Assessment: 0-10 Pain Score: 4  Faces Pain Scale: Hurts a little bit Pain Location: R hip Pain Descriptors / Indicators: Grimacing;Guarding Pain Intervention(s): Limited activity within patient's tolerance;Monitored during session;Premedicated before session;Repositioned           PT Goals (current goals can now be found in the care plan section) Acute Rehab PT Goals Patient  Stated Goal: none stated Progress towards PT goals: Progressing toward goals    Frequency    7X/week      PT Plan Current plan remains appropriate       AM-PAC PT "6 Clicks" Mobility   Outcome Measure   Help needed turning from your back to your side while in a flat bed without using bedrails?: A Lot Help needed moving from lying on your back to sitting on the side of a flat bed without using bedrails?: A Lot Help needed moving to and from a bed to a chair (including a wheelchair)?: A Lot Help needed standing up from a chair using your arms (e.g., wheelchair or bedside chair)?: A Lot Help needed to walk in hospital room?: Total Help needed climbing 3-5 steps with a railing? : Total 6 Click Score: 10    End of Session Equipment Utilized During Treatment: Gait belt Activity Tolerance: Patient tolerated treatment well Patient left: in bed;with call bell/phone within reach;with bed alarm set;with family/visitor present Nurse Communication: Mobility status PT Visit Diagnosis: Other abnormalities of gait and mobility (R26.89);Muscle weakness (generalized) (M62.81);Difficulty in walking, not elsewhere classified (R26.2);Pain Pain - Right/Left: Right Pain - part of body: Hip     Time: 7017-7939 PT Time Calculation (min) (ACUTE ONLY): 35 min  Charges:  $Therapeutic Activity: 23-37 mins                     Jetta Lout PTA 12/15/20, 3:38 PM

## 2020-12-15 NOTE — Progress Notes (Addendum)
Subjective: Overall, the patient feels that her right hip pain is quite well controlled at this time.  However, she became quite orthostatic when she got up with physical therapy this morning with her blood pressure dipping down to 83/50's, so she was placed back into bed.  No other new complaints are expressed by the patient.  Objective: Vital signs in last 24 hours: Temp:  [97.7 F (36.5 C)-98.4 F (36.9 C)] 97.8 F (36.6 C) (03/15 1147) Pulse Rate:  [80-95] 80 (03/15 1147) Resp:  [14-20] 14 (03/15 1147) BP: (88-110)/(54-82) 100/60 (03/15 1147) SpO2:  [96 %-99 %] 99 % (03/15 1147)  Intake/Output from previous day: 03/14 0701 - 03/15 0700 In: 120 [P.O.:120] Out: 1950 [Urine:1950] Intake/Output this shift: No intake/output data recorded.  Recent Labs    12/13/20 1527 12/14/20 0928 12/15/20 0459  HGB 10.6* 9.5* 9.1*   Recent Labs    12/14/20 0928 12/15/20 0459  WBC 10.4 11.4*  RBC 3.30* 3.11*  HCT 30.0* 27.9*  PLT 213 216   Recent Labs    12/14/20 0928 12/15/20 0459  NA 134* 133*  K 4.3 4.6  CL 100 99  CO2 26 27  BUN 23 27*  CREATININE 1.07* 1.01*  GLUCOSE 102* 105*  CALCIUM 8.8* 8.7*   No results for input(s): LABPT, INR in the last 72 hours.  Physical Exam: Orthopedic examination is again limited to the right hip and lower extremity.  Her findings were unchanged as compared to yesterday.  She remains neurovascularly intact to the right lower extremity and foot.  Assessment: Essentially nondisplaced greater trochanteric fracture status post prior right hip hemiarthroplasty.  Plan: We will continue nonsurgical treatment for this injury.  Given her orthostasis during physical therapy today, and an order for a 500 cc bolus of normal saline was placed into the patient's EMR.  In addition, she will be started on 75 cc of normal saline IV fluids to try to support her blood pressure.  The hospitalist has been notified of these actions.  Physical therapy may be  resumed when she is hemodynamically stable.   Cindy Mcdonald 12/15/2020, 12:04 PM

## 2020-12-16 ENCOUNTER — Inpatient Hospital Stay: Payer: Medicare HMO

## 2020-12-16 DIAGNOSIS — F0390 Unspecified dementia without behavioral disturbance: Secondary | ICD-10-CM

## 2020-12-16 DIAGNOSIS — S72001S Fracture of unspecified part of neck of right femur, sequela: Secondary | ICD-10-CM

## 2020-12-16 DIAGNOSIS — R5383 Other fatigue: Secondary | ICD-10-CM

## 2020-12-16 DIAGNOSIS — E785 Hyperlipidemia, unspecified: Secondary | ICD-10-CM

## 2020-12-16 DIAGNOSIS — E43 Unspecified severe protein-calorie malnutrition: Secondary | ICD-10-CM

## 2020-12-16 LAB — CBC
HCT: 28.9 % — ABNORMAL LOW (ref 36.0–46.0)
Hemoglobin: 9.2 g/dL — ABNORMAL LOW (ref 12.0–15.0)
MCH: 29 pg (ref 26.0–34.0)
MCHC: 31.8 g/dL (ref 30.0–36.0)
MCV: 91.2 fL (ref 80.0–100.0)
Platelets: 205 10*3/uL (ref 150–400)
RBC: 3.17 MIL/uL — ABNORMAL LOW (ref 3.87–5.11)
RDW: 14 % (ref 11.5–15.5)
WBC: 15.6 10*3/uL — ABNORMAL HIGH (ref 4.0–10.5)
nRBC: 0 % (ref 0.0–0.2)

## 2020-12-16 LAB — BASIC METABOLIC PANEL
Anion gap: 6 (ref 5–15)
BUN: 24 mg/dL — ABNORMAL HIGH (ref 8–23)
CO2: 27 mmol/L (ref 22–32)
Calcium: 8.4 mg/dL — ABNORMAL LOW (ref 8.9–10.3)
Chloride: 106 mmol/L (ref 98–111)
Creatinine, Ser: 0.8 mg/dL (ref 0.44–1.00)
GFR, Estimated: >60 mL/min (ref >60)
Glucose, Bld: 113 mg/dL — ABNORMAL HIGH (ref 70–99)
Potassium: 4.8 mmol/L (ref 3.5–5.1)
Sodium: 139 mmol/L (ref 135–145)

## 2020-12-16 LAB — SARS CORONAVIRUS 2 (TAT 6-24 HRS): SARS Coronavirus 2: NEGATIVE

## 2020-12-16 MED ORDER — ACETAMINOPHEN 325 MG PO TABS
325.0000 mg | ORAL_TABLET | Freq: Four times a day (QID) | ORAL | Status: DC | PRN
  Filled 2020-12-16: qty 1

## 2020-12-16 MED ORDER — SODIUM CHLORIDE 0.9 % IV BOLUS
250.0000 mL | Freq: Once | INTRAVENOUS | Status: AC
  Administered 2020-12-16: 250 mL via INTRAVENOUS

## 2020-12-16 MED ORDER — ENOXAPARIN SODIUM 40 MG/0.4ML ~~LOC~~ SOLN
40.0000 mg | SUBCUTANEOUS | Status: DC
  Administered 2020-12-16: 40 mg via SUBCUTANEOUS
  Filled 2020-12-16: qty 0.4

## 2020-12-16 MED ORDER — FLEET ENEMA 7-19 GM/118ML RE ENEM: 1.0000 | ENEMA | Freq: Once | RECTAL | Status: DC

## 2020-12-16 MED ORDER — MIRTAZAPINE 15 MG PO TABS
7.5000 mg | ORAL_TABLET | Freq: Every day | ORAL | Status: DC
  Administered 2020-12-16: 7.5 mg via ORAL
  Filled 2020-12-16: qty 1

## 2020-12-16 MED ORDER — SODIUM CHLORIDE 0.9 % IV SOLN: INTRAVENOUS | Status: DC

## 2020-12-16 MED ORDER — HYDROCODONE-ACETAMINOPHEN 5-325 MG PO TABS
1.0000 | ORAL_TABLET | Freq: Four times a day (QID) | ORAL | Status: AC | PRN
Start: 2020-12-16 — End: 2020-12-16
  Administered 2020-12-16: 1 via ORAL
  Filled 2020-12-16: qty 1

## 2020-12-16 NOTE — Progress Notes (Signed)
Subjective: The patient denies any pain in the hip at this time.  She has no new complaints.  According to the patient's daughter, she does seem to be sleeping quite a bit and has been unmotivated overall, so she was sent for CT scanning of her head this afternoon.   Objective: Vital signs in last 24 hours: Temp:  [98.2 F (36.8 C)-99.2 F (37.3 C)] 98.2 F (36.8 C) (03/16 1543) Pulse Rate:  [79-104] 97 (03/16 1543) Resp:  [15-18] 16 (03/16 1543) BP: (101-134)/(55-75) 107/64 (03/16 1543) SpO2:  [96 %-100 %] 100 % (03/16 1543)  Intake/Output from previous day: 03/15 0701 - 03/16 0700 In: 1581.2 [I.V.:1081.2; IV Piggyback:500] Out: 300 [Urine:300] Intake/Output this shift: Total I/O In: 662.7 [I.V.:412.7; IV Piggyback:250] Out: 200 [Urine:200]  Recent Labs    12/14/20 0928 12/15/20 0459 12/16/20 0557  HGB 9.5* 9.1* 9.2*   Recent Labs    12/15/20 0459 12/16/20 0557  WBC 11.4* 15.6*  RBC 3.11* 3.17*  HCT 27.9* 28.9*  PLT 216 205   Recent Labs    12/15/20 0459 12/16/20 0557  NA 133* 139  K 4.6 4.8  CL 99 106  CO2 27 27  BUN 27* 24*  CREATININE 1.01* 0.80  GLUCOSE 105* 113*  CALCIUM 8.7* 8.4*   No results for input(s): LABPT, INR in the last 72 hours.  Physical Exam: Orthopedic examination again is limited to the right hip and lower extremity.  Skin inspection around the right hip is notable for a well-healed surgical incision, but otherwise is unremarkable.  No swelling, erythema, ecchymosis, abrasions, or other skin abnormalities are identified.  She has at most minimal tenderness to firm palpation over the lateral aspect of the right hip.  She is able to tolerate hip flexion to 75 degrees.  At 75 degrees of flexion, she can tolerate internal rotation to 10 degrees and external rotation of 25 degrees.  She notes mild discomfort at the extremes of these rotational movements.  She is not yet able to perform an active straight leg raise on her own.  She is  neurovascularly intact to the right lower extremity and foot.  Assessment: Essentially nondisplaced closed right greater trochanteric fracture status post prior right hip hemiarthroplasty.  Plan: The patient will continue with physical therapy, weightbearing as tolerated on the right lower extremity and using a walker for balance and support.  She may be progressed as symptoms permit.  She may continue to receive medication for pain as deemed appropriate clinically.  I will plan on signing off at this time.  Thank you for asking me to participate in the care of this most delightful patient.  Please arrange for her to follow-up in our office with either Horris Latino, PA-C, or myself in 1 month for repeat x-rays of her pelvis and right hip.  If you do need further orthopedic input during this admission, please reconsult me.   Cindy Mcdonald 12/16/2020, 4:41 PM

## 2020-12-16 NOTE — Plan of Care (Signed)

## 2020-12-16 NOTE — Progress Notes (Signed)
Patient ID: Cindy Mcdonald, female   DOB: 12/08/1929, 87 y.o.   MRN: [id number] [facility] PROGRESS NOTE  Hansel FeinsteinMildred M Lembke WUJ:[id number] DOB: 08/30/1930 DOA: 12/13/2020 PCP: Jaclyn Shaggyate, Denny C, MD  HPI/Subjective: Foley discontinued this morning and still has not urinated.  Patient answers a few questions.  Daughter states that she has not been eating very well.  Yesterday was orthostatic.  Daughter asked for something for appetite.  Nurse states that she has been more lethargic during the day.  Objective: Vitals:   12/16/20 1455 12/16/20 1543  BP: 101/75 107/64  Pulse: (!) 104 97  Resp:  16  Temp: 99.1 F (37.3 C) 98.2 F (36.8 C)  SpO2: 96% 100%    Intake/Output Summary (Last 24 hours) at 12/16/2020 1635 Last data filed at 12/16/2020 1432 Gross per 24 hour  Intake 1743.83 ml  Output 500 ml  Net 1243.83 ml   Filed Weights   12/13/20 1519  Weight: 47.6 kg    ROS: Review of Systems  Respiratory: Negative for shortness of breath.   Cardiovascular: Negative for chest pain.  Gastrointestinal: Negative for abdominal pain, nausea and vomiting.   Exam: Physical Exam HENT:     Head: Normocephalic.     Mouth/Throat:     Pharynx: No oropharyngeal exudate.  Eyes:     General: Lids are normal.     Conjunctiva/sclera: Conjunctivae normal.     Pupils: Pupils are equal, round, and reactive to light.  Cardiovascular:     Rate and Rhythm: Normal rate and regular rhythm.     Heart sounds: Normal heart sounds, S1 normal and S2 normal.  Pulmonary:     Breath sounds: Normal breath sounds. No decreased breath sounds, wheezing, rhonchi or rales.  Abdominal:     Palpations: Abdomen is soft.     Tenderness: There is no abdominal tenderness.  Musculoskeletal:     Right lower leg: No swelling.     Left lower leg: No swelling.  Skin:    General: Skin is warm.     Findings: No rash.     Comments: Right facial brusing  Neurological:     Mental Status: She is alert.       Data  Reviewed: Basic Metabolic Panel: Recent Labs  Lab 12/13/20 1527 12/14/20 0928 12/15/20 0459 12/16/20 0557  NA 132* 134* 133* 139  K 4.3 4.3 4.6 4.8  CL 98 100 99 106  CO2 25 26 27 27   GLUCOSE 147* 102* 105* 113*  BUN 30* 23 27* 24*  CREATININE 1.03* 1.07* 1.01* 0.80  CALCIUM 9.1 8.8* 8.7* 8.4*   Liver Function Tests: Recent Labs  Lab 12/13/20 1527  AST 30  ALT 15  ALKPHOS 60  BILITOT 0.7  PROT 6.6  ALBUMIN 3.5   CBC: Recent Labs  Lab 12/13/20 1527 12/14/20 0928 12/15/20 0459 12/16/20 0557  WBC 15.0* 10.4 11.4* 15.6*  NEUTROABS 12.8*  --   --   --   HGB 10.6* 9.5* 9.1* 9.2*  HCT 32.4* 30.0* 27.9* 28.9*  MCV 88.5 90.9 89.7 91.2  PLT 279 213 216 205   Cardiac Enzymes: Recent Labs  Lab 12/13/20 1527  CKTOTAL 184     Recent Results (from the past 240 hour(s))  SARS CORONAVIRUS 2 (TAT 6-24 HRS) Nasopharyngeal Nasopharyngeal Swab     Status: None   Collection Time: 12/13/20  5:30 PM   Specimen: Nasopharyngeal Swab  Result Value Ref Range Status   SARS Coronavirus 2 NEGATIVE NEGATIVE Final  Comment: (NOTE) SARS-CoV-2 target nucleic acids are NOT DETECTED.  The SARS-CoV-2 RNA is generally detectable in upper and lower respiratory specimens during the acute phase of infection. Negative results do not preclude SARS-CoV-2 infection, do not rule out co-infections with other pathogens, and should not be used as the sole basis for treatment or other patient management decisions. Negative results must be combined with clinical observations, patient history, and epidemiological information. The expected result is Negative.  Fact Sheet for Patients: HairSlick.no  Fact Sheet for Healthcare Providers: quierodirigir.com  This test is not yet approved or cleared by the [place] FDA and  has been authorized for detection and/or diagnosis of SARS-CoV-2 by FDA under an Emergency Use Authorization (EUA).  This EUA will remain  in effect (meaning this test can be used) for the duration of the COVID-19 declaration under Se ction 564(b)(1) of the Act, 21 U.S.C. section 360bbb-3(b)(1), unless the authorization is terminated or revoked sooner.  Performed at [facility], [address], [place], [place] [address]   SARS CORONAVIRUS 2 (TAT 6-24 HRS) Nasopharyngeal Nasopharyngeal Swab     Status: None   Collection Time: 12/15/20  6:05 PM   Specimen: Nasopharyngeal Swab  Result Value Ref Range Status   SARS Coronavirus 2 NEGATIVE NEGATIVE Final    Comment: (NOTE) SARS-CoV-2 target nucleic acids are NOT DETECTED.  The SARS-CoV-2 RNA is generally detectable in upper and lower respiratory specimens during the acute phase of infection. Negative results do not preclude SARS-CoV-2 infection, do not rule out co-infections with other pathogens, and should not be used as the sole basis for treatment or other patient management decisions. Negative results must be combined with clinical observations, patient history, and epidemiological information. The expected result is Negative.  Fact Sheet for Patients: HairSlick.no  Fact Sheet for Healthcare Providers: quierodirigir.com  This test is not yet approved or cleared by the [place] FDA and  has been authorized for detection and/or diagnosis of SARS-CoV-2 by FDA under an Emergency Use Authorization (EUA). This EUA will remain  in effect (meaning this test can be used) for the duration of the COVID-19 declaration under Se ction 564(b)(1) of the Act, 21 U.S.C. section 360bbb-3(b)(1), unless the authorization is terminated or revoked sooner.  Performed at [facility], [address] [place], [place] [address]      Studies: MR FACE/TRIGEMINAL WO/W CM  Result Date: 12/14/2020 CLINICAL DATA:  Chronic or recurrent maxillary and sphenoid sinusitis with posterior bone erosion.  Assess intracranial involvement. Dementia. EXAM: MRI FACE TRIGEMINAL WITHOUT AND WITH CONTRAST TECHNIQUE: Multiplanar, multiecho pulse sequences of the face and surrounding structures, including thin slice imaging of the course of the Trigeminal Nerves, were obtained both before and after administration of intravenous contrast. CONTRAST:  37mL GADAVIST GADOBUTROL 1 MMOL/ML IV SOLN COMPARISON:  CT yesterday. FINDINGS: As seen, the brain shows generalized atrophy. There chronic small-vessel ischemic changes of the brainstem, cerebellum, thalami and hemispheric white matter. No sign of hydrocephalus. The brain was not studied in a complete fashion. There is no evidence of dural thickening or leptomeningeal enhancement. Specifically, where the sphenoid sinus disease has resulted in erosion of the clivus to the right of midline, the adjacent dura does not appear thickened or enhancing in there is no intracranial collection. Complete opacification of the right maxillary sinus and right division of the sphenoid sinus as shown by CT, with T2 signal loss that can be seen with either chronic fungal sinusitis or densely inspissated mucus. Chronic  fungal sinusitis is favored in this case. At the site of erosion of the posterolateral wall of the right maxillary sinus, material bulges into the infratemporal fossa/masticator space slightly, but does not appear to insight any significant inflammatory response. Nasopharynx and oropharynx appear normal. No primary bone lesion seen. Parotid glands and submandibular glands are normal. The globes are normal as visualized. IMPRESSION: Chronic sinus disease affecting the right maxillary and sphenoid sinus. Bone erosion of the posterolateral wall of the right maxillary sinus into the posterior sphenoid sinus wall/clivus. In both locations, there does not appear to be any significant inflammatory reaction. No evidence of dural thickening or enhancement. No sign intradural spread or acute  pathologic finding. No significant inflammatory change in the infratemporal fossa/masticator space. The sinus disease is favored to represent chronic fungal sinusitis, though chronic inspissated mucus could have similar appearance. Bone erosion would be less common in the absence of fungal disease. Electronically Signed   By: Paulina Fusi M.D.   On: 12/14/2020 17:18    Scheduled Meds: . Chlorhexidine Gluconate Cloth  6 each Topical Daily  . donepezil  5 mg Oral QHS  . dorzolamide-timolol  1 drop Both Eyes BID  . enoxaparin (LOVENOX) injection  40 mg Subcutaneous Q24H  . feeding supplement  237 mL Oral TID BM  . latanoprost  1 drop Both Eyes QHS  . memantine  10 mg Oral BID  . mirtazapine  7.5 mg Oral QHS  . multivitamin with minerals  1 tablet Oral Daily  . sodium phosphate  1 enema Rectal Once    Assessment/Plan:  1. Lethargy, poor appetite, advanced dementia. Will repeat CT Head since did have a fall and left facial bruising. Will get palliative care. On aricept and namenda. 2. Periprosthetic fracture of the right greater trochanter. Pain control. 3. Chronic sinusitis.  Follow up with ENT as outpatient 4. Severe protein calorie malnutrition 5. bruising right face and arms 6. Relative hypotension- hold antihypertensive medications 7. Hyperlipidemia unspecified.  Hold statin 8. Palliative care consultation 9. We discontinued Foley this morning and patient still has not urinated yet.  Awaiting bladder scan.       Code Status:     Code Status Orders  (From admission, onward)         Start     Ordered   12/13/20 1818  Do not attempt resuscitation (DNR)  Continuous       Question Answer Comment  In the event of cardiac or respiratory ARREST Do not call a "code blue"   In the event of cardiac or respiratory ARREST Do not perform Intubation, CPR, defibrillation or ACLS   In the event of cardiac or respiratory ARREST Use medication by any route, position, wound care, and other  measures to relive pain and suffering. May use oxygen, suction and manual treatment of airway obstruction as needed for comfort.      12/13/20 1820        Code Status History    Date Active Date Inactive Code Status Order ID Comments User Context   05/02/2018 1058 05/03/2018 1308 Full Code [id number]  Schnier, Latina Craver, MD Inpatient   Advance Care Planning Activity     Family Communication: Spoke with daughter at the bedside and on the phone. Disposition Plan: Status is: Inpatient  Dispo: The patient is from: Home              Anticipated d/c is to: Rehab  Patient currently not ready to be discharged at this point.  Lethargy this afternoon.  We will get a palliative care consultation repeat a CAT scan of the head.   Difficult to place patient.  Hopefully not.  Time spent: 28 minutes  [employer]

## 2020-12-16 NOTE — Progress Notes (Signed)
Physical Therapy Treatment Patient Details Name: Cindy Mcdonald MRN: [id number] DOB: 03/08/1930 Today's Date: 12/16/2020    History of Present Illness Pt is an 87 y.o. female with medical history significant for hypertension, dementia, glaucoma, presented to the emergency department for chief concerns of fall 12/12/2020. Xray showed Periprosthetic fracture of the right greater trochanter with mild displacement. Per Dr. Joice Lofts, no surgical intervention needed and WBAT with RW and assistance as needed    PT Comments    Pt was long sitting in bed upon arriving. Supportive daughter at bedside. Resting BP 118/60. Max assist to achieve EOB short sit. 120/65 sitting EOB. Prolonged sitting at EOB with there ex performed prior to OOB activity. BP after ~ 4 minutes seated EOB 103/64 but no c/o dizziness. Required max assist to stand. Pt has more difficulty initiating movements today. Was unable to clear floor to progress to taking steps. Needs max assist to pivot to recliner. She was in recliner at conclusion of session. Was able to convince pt to eat one peanut butter cracker during session. Highly recommend DC to SNF at DC.      Follow Up Recommendations  SNF     Equipment Recommendations  Other (comment) (defer to next level of care)       Precautions / Restrictions Precautions Precautions: Fall Restrictions Weight Bearing Restrictions: Yes RLE Weight Bearing: Weight bearing as tolerated    Mobility  Bed Mobility Overal bed mobility: Needs Assistance Bed Mobility: Supine to Sit     Supine to sit: Max assist     General bed mobility comments: pt required max assist to progress from supine(HOB elevatyed) to short sit EOB. Max assist for progressing BLes and trunk support    Transfers Overall transfer level: Needs assistance Equipment used: Rolling walker (2 wheeled) Transfers: Sit to/from Stand Sit to Stand: Max assist;Total assist;From elevated surface Stand pivot transfers: Max  assist       General transfer comment: pt struggles with initiation of movements this date. much less talkative and more flat than yesterday.  Ambulation/Gait Ambulation/Gait assistance: Max assist Gait Distance (Feet): 3 Feet Assistive device: Rolling walker (2 wheeled) Gait Pattern/deviations: Antalgic Gait velocity: decreased   General Gait Details: shuffles feet on floor. hard time today following commands.       Balance Overall balance assessment: Needs assistance Sitting-balance support: No upper extremity supported;Feet supported Sitting balance-Leahy Scale: Good     Standing balance support: Bilateral upper extremity supported;During functional activity Standing balance-Leahy Scale: Poor Standing balance comment: very high fall risk       Cognition Arousal/Alertness: Awake/alert Behavior During Therapy: Flat affect Overall Cognitive Status: History of cognitive impairments - at baseline      General Comments: Pt is A and cooperative however has baseline cognition deficits. very flat      Exercises General Exercises - Lower Extremity Ankle Circles/Pumps: AROM;10 reps Quad Sets: AROM;5 reps Gluteal Sets: AROM;5 reps Heel Slides: AROM;5 reps Hip ABduction/ADduction: AAROM;5 reps Straight Leg Raises: AAROM;5 reps        Pertinent Vitals/Pain Pain Assessment: Faces Faces Pain Scale: Hurts little more Pain Location: R hip Pain Descriptors / Indicators: Grimacing;Guarding Pain Intervention(s): Limited activity within patient's tolerance;Monitored during session;Repositioned           PT Goals (current goals can now be found in the care plan section) Acute Rehab PT Goals Patient Stated Goal: none stated Progress towards PT goals: Progressing toward goals (cognition most limiting)    Frequency    7X/week  PT Plan Current plan remains appropriate       AM-PAC PT "6 Clicks" Mobility   Outcome Measure  Help needed turning from your back  to your side while in a flat bed without using bedrails?: A Lot Help needed moving from lying on your back to sitting on the side of a flat bed without using bedrails?: A Lot Help needed moving to and from a bed to a chair (including a wheelchair)?: A Lot Help needed standing up from a chair using your arms (e.g., wheelchair or bedside chair)?: A Lot Help needed to walk in hospital room?: Total Help needed climbing 3-5 steps with a railing? : Total 6 Click Score: 10    End of Session Equipment Utilized During Treatment: Gait belt Activity Tolerance: Patient limited by fatigue Patient left: in chair;with call bell/phone within reach;with family/visitor present Nurse Communication: Mobility status PT Visit Diagnosis: Other abnormalities of gait and mobility (R26.89);Muscle weakness (generalized) (M62.81);Difficulty in walking, not elsewhere classified (R26.2);Pain Pain - Right/Left: Right Pain - part of body: Hip     Time: 1655-3748 PT Time Calculation (min) (ACUTE ONLY): 40 min  Charges:  $Therapeutic Exercise: 8-22 mins $Therapeutic Activity: 23-37 mins                     Jetta Lout PTA 12/16/20, 1:03 PM

## 2020-12-16 NOTE — Progress Notes (Signed)
PHARMACIST - PHYSICIAN COMMUNICATION  CONCERNING:  Enoxaparin (Lovenox) for DVT Prophylaxis   DESCRIPTION: Patient was prescribed enoxaprin 30mg  q24 hours for VTE prophylaxis based on CrCl <59ml/min  Filed Weights   12/13/20 1519  Weight: 47.6 kg (105 lb)    Body mass index is 19.2 kg/m.  Estimated Creatinine Clearance: 36.5 mL/min (by C-G formula based on SCr of 0.8 mg/dL).   Patient is candidate for enoxaparin 40 mg every 24 hours based on CrCl >32ml/min   RECOMMENDATION: Pharmacy has adjusted enoxaparin dose per [facility] policy.  Patient is now receiving enoxaparin 40 mg every 24 hours    [facility], PharmD Clinical Pharmacist  12/16/2020 3:42 PM

## 2020-12-17 DIAGNOSIS — Z66 Do not resuscitate: Secondary | ICD-10-CM

## 2020-12-17 DIAGNOSIS — I959 Hypotension, unspecified: Secondary | ICD-10-CM

## 2020-12-17 DIAGNOSIS — R338 Other retention of urine: Secondary | ICD-10-CM

## 2020-12-17 DIAGNOSIS — S72001A Fracture of unspecified part of neck of right femur, initial encounter for closed fracture: Secondary | ICD-10-CM

## 2020-12-17 DIAGNOSIS — Z7189 Other specified counseling: Secondary | ICD-10-CM

## 2020-12-17 DIAGNOSIS — Z515 Encounter for palliative care: Secondary | ICD-10-CM

## 2020-12-17 DIAGNOSIS — F0390 Unspecified dementia without behavioral disturbance: Secondary | ICD-10-CM

## 2020-12-17 MED ORDER — MIRTAZAPINE 7.5 MG PO TABS: 7.5000 mg | ORAL_TABLET | Freq: Every day | ORAL | 0 refills | Status: DC

## 2020-12-17 MED ORDER — ENSURE ENLIVE PO LIQD: 237.0000 mL | Freq: Three times a day (TID) | ORAL | 12 refills | Status: DC

## 2020-12-17 MED ORDER — ADULT MULTIVITAMIN W/MINERALS CH: 1.0000 | ORAL_TABLET | Freq: Every day | ORAL | 0 refills | Status: DC

## 2020-12-17 MED ORDER — VIACTIV CALCIUM PLUS D 650-12.5-40 MG-MCG PO CHEW: 1.0000 | CHEWABLE_TABLET | Freq: Two times a day (BID) | ORAL | Status: DC

## 2020-12-17 MED ORDER — ENOXAPARIN SODIUM 40 MG/0.4ML ~~LOC~~ SOLN: 40.0000 mg | SUBCUTANEOUS | 0 refills | Status: AC

## 2020-12-17 MED ORDER — MELATONIN 5 MG PO TABS: 5.0000 mg | ORAL_TABLET | Freq: Every evening | ORAL | 0 refills | Status: DC | PRN

## 2020-12-17 MED ORDER — ACETAMINOPHEN 325 MG PO TABS: 325.0000 mg | ORAL_TABLET | Freq: Four times a day (QID) | ORAL | Status: DC | PRN

## 2020-12-17 NOTE — Consult Note (Signed)
Consultation Note Date: 12/17/2020   Patient Name: Cindy Mcdonald  DOB: [birth year]  MRN: [id number]  Age / Sex: 87 y.o., female  PCP: Albina Billet, MD Referring Physician: Loletha Grayer, MD  Reason for Consultation: Establishing goals of care  HPI/Patient Profile: 87 y.o. female  with past medical history of dementia, HLD, and HTN admitted on 12/13/2020 with a fall.  Diagnosed with periprosthetic fracture of the right greater trochanter with mild displacement - no surgery recommended. Throughout hospitalization patient has been lethargic with poor PO intake. CT head negative. PMT consulted to discuss [place].    Clinical Assessment and Goals of Care: I have reviewed medical records including EPIC notes, labs and imaging, received report from Dr. Leslye Peer, assessed the patient and then met with patient's two daughters Joelene Millin and Coralyn Mark,  to discuss diagnosis prognosis, [place], EOL wishes, disposition and options.  I introduced Palliative Medicine as specialized medical care for people living with serious illness. It focuses on providing relief from the symptoms and stress of a serious illness. The goal is to improve quality of life for both the patient and the family.  As far as functional and nutritional status, they tell me patient is mostly non ambulatory. She has become very weak. They were trying some outpatient physical therapy however patient's function has actually declined since this was started. They tell me she was unable to bathe or toilet herself independently. Very poor appetite - almost a 30 pound weight loss over the past 7 months. They also speak of worsening cognition. No short-term recall. Does still remember names of her children, but not grandchildren.   We discussed patient's current illness and what it means in the larger context of patient's on-going co-morbidities.  Natural disease trajectory and expectations at EOL were discussed.  We discuss chronic, progressive nature of dementia and changes to expect as dementia progresses. Discussed planning for further decline in function, nutrition, and cognition.   I attempted to elicit values and goals of care important to the patient.  Daughters both share patient would not want extreme, aggressive measures just to prolong her life without good quality of life.   The difference between aggressive medical intervention and comfort care was considered in light of the patient's goals of care. Advance directives, concepts specific to code status, artificial feeding and hydration, and rehospitalization were considered and discussed.  We discussed that evidence has shown that PEG tubes in patients with advanced dementia do not increase survival, prevent aspiration, or improve wound healing.  They can promote isolation and use of restraints leading to increased potential for pressure ulcers. Use of PEG tubes is not medically recommended in this population; rather, careful hand feeding with aspiration precautions has been shown to provide the best quality of life.   I completed a MOST form today. The patient and family outlined their wishes for the following treatment decisions:  Cardiopulmonary Resuscitation: Do Not Attempt Resuscitation (DNR/No CPR)  Medical Interventions: Limited Additional Interventions: Use medical treatment, IV fluids and cardiac monitoring as indicated, DO NOT USE intubation or mechanical ventilation. May consider use of less invasive airway support such as BiPAP or CPAP. Also provide comfort measures. Transfer to the hospital if indicated. Avoid intensive care.   Antibiotics: Antibiotics if indicated  IV Fluids: IV fluids if indicated  Feeding Tube: No feeding tube   Discussed with family the importance of continued conversation with family and the medical providers regarding overall plan of care and treatment options, ensuring decisions are within the  context of the  patient's values and GOCs.    Hospice and Palliative Care services outpatient were explained and offered. Family is agreeable to palliative care follow up outpatient - at SNF. We discussed when a transition to hospice care would be appropriate.   Questions and concerns were addressed. The family was encouraged to call with questions or concerns.   Primary Decision Maker HCPOA - daughter Jola Babinski  SUMMARY OF RECOMMENDATIONS   -  Dc today to SNF - outpatient palliative to follow at SNF - MOST completed as above - hospice introduced, discussed when would be eligible/appropriate - family open to hospice in the future  Code Status/Advance Care Planning:  DNR  Discharge Planning: [place] for rehab with Palliative care service follow-up      Primary Diagnoses: Present on Admission: . Hip fracture ([place]) . CAD (coronary artery disease) . Hyperlipidemia . Essential hypertension . Carotid stenosis, asymptomatic, left . Protein malnutrition ([place]) . Hip fracture, right ([place])   I have reviewed the medical record, interviewed the patient and family, and examined the patient. The following aspects are pertinent.  Past Medical History:  Diagnosis Date  . Arthritis   . Carotid artery occlusion   . Carotid artery occlusion   . Complication of anesthesia    difficulty waking up for 3 days after back surgery  . Hyperlipidemia   . Hypertension    Social History   Socioeconomic History  . Marital status: Widowed    Spouse name: Not on file  . Number of children: Not on file  . Years of education: Not on file  . Highest education level: Not on file  Occupational History  . Not on file  Tobacco Use  . Smoking status: Never Smoker  . Smokeless tobacco: Never Used  Vaping Use  . Vaping Use: Never used  Substance and Sexual Activity  . Alcohol use: Not Currently  . Drug use: Never  . Sexual activity: Not on file  Other Topics Concern  . Not on file   Social History Narrative  . Not on file   Social Determinants of Health   Financial Resource Strain: Not on file  Food Insecurity: Not on file  Transportation Needs: Not on file  Physical Activity: Not on file  Stress: Not on file  Social Connections: Not on file   Family History  Problem Relation Age of Onset  . Hypertension Mother   . Heart disease Father    Scheduled Meds: . Chlorhexidine Gluconate Cloth  6 each Topical Daily  . donepezil  5 mg Oral QHS  . dorzolamide-timolol  1 drop Both Eyes BID  . enoxaparin (LOVENOX) injection  40 mg Subcutaneous Q24H  . feeding supplement  237 mL Oral TID BM  . latanoprost  1 drop Both Eyes QHS  . memantine  10 mg Oral BID  . mirtazapine  7.5 mg Oral QHS  . multivitamin with minerals  1 tablet Oral Daily  . sodium phosphate  1 enema Rectal Once   Continuous Infusions: PRN Meds:.acetaminophen, melatonin Allergies  Allergen Reactions  . Codeine     Unknown  . Nsaids     Dizziness, tears up stomach  . Statins     Leg pain    Review of Systems  Constitutional: Positive for activity change, appetite change and fatigue.       Denies pain    Physical Exam Constitutional:      General: She is not in acute distress.  Comments: Awakens easily to voice  Pulmonary:     Effort: Pulmonary effort is normal.  Skin:    General: Skin is warm and dry.  Neurological:     Mental Status: She is disoriented.  Psychiatric:        Cognition and Memory: Cognition is impaired. Memory is impaired.     Vital Signs: BP 132/66 (BP Location: Left Arm)   Pulse 99   Temp 98.4 F (36.9 C) (Oral)   Resp 19   Ht 5' 2" (1.575 m)   Wt 47.6 kg   SpO2 97%   BMI 19.20 kg/m  Pain Scale: 0-10 POSS *See Group Information*: 1-Acceptable,Awake and alert Pain Score: Asleep   SpO2: SpO2: 97 % O2 Device:SpO2: 97 % O2 Flow Rate: .   IO: Intake/output summary:   Intake/Output Summary (Last 24 hours) at 12/17/2020 1219 Last data filed at  12/17/2020 1009 Gross per 24 hour  Intake 761.7 ml  Output 1007 ml  Net -245.3 ml    LBM: Last BM Date: 12/17/20 Baseline Weight: Weight: 47.6 kg Most recent weight: Weight: 47.6 kg     Palliative Assessment/Data: PPS 50%    Time Total: 75 minutes Greater than 50%  of this time was spent counseling and coordinating care related to the above assessment and plan.  Juel Burrow, DNP, AGNP-C Palliative Medicine Team [phone] Pager: [phone]

## 2020-12-17 NOTE — TOC Transition Note (Signed)
Transition of Care [facility]) - CM/SW Discharge Note   Patient Details  Name: Cindy Mcdonald MRN: [id number] Date of Birth: 07-26-1930  Transition of Care [facility]) CM/SW Contact:  Barrie Dunker, RN Phone Number: 12/17/2020, 3:35 PM   Clinical Narrative:    Patient Discharging to room 605A to [facility], EMS called and will have her next on list Daughter Selena Batten in the room and is aware DC packet on the chart, Nurse to call report      Barriers to Discharge: Insurance Authorization,Continued Medical Work up   Patient Goals and CMS Choice   CMS Medicare.gov Compare Post Acute Care list provided to:: Patient Represenative (must comment) (Instructed daughters on how to access online.)    Discharge Placement                       Discharge Plan and Services     Post Acute Care Choice: Skilled Nursing Facility                               Social Determinants of Health (SDOH) Interventions     Readmission Risk Interventions No flowsheet data found.

## 2020-12-17 NOTE — Progress Notes (Signed)
[facility] Room 138 [facility]) Hospital Liaison RN note:  Received new referral for [facility] out patient palliative program to follow post discharge from [place], NP. Patient information given to referral. Marice Potter, TOC is aware. Plan is to discharge to [facility] today.  Thank you for this referral.  Cyndra Numbers, RN [facility] Liaison [phone]

## 2020-12-17 NOTE — Progress Notes (Signed)
Attempted report to [facility]. 1600. (# called [phone])

## 2020-12-17 NOTE — Discharge Summary (Signed)
[facility] [place] [facility]   PATIENT NAME: Cindy Mcdonald    MR#:  [id number]  DATE OF BIRTH:  1930-01-06  DATE OF ADMISSION:  12/13/2020 ADMITTING PHYSICIAN: [name], MD  DATE OF DISCHARGE: 12/17/2020  PRIMARY CARE PHYSICIAN: Jaclyn Shaggy, MD    ADMISSION DIAGNOSIS:  Hip fracture (HCC) [S72.009A] Fall [W19.XXXA] Fall, initial encounter [id number]] Closed fracture of right hip, initial encounter (HCC) [S72.001A] Hip fracture, right (HCC) [S72.001A]  DISCHARGE DIAGNOSIS:  Principal Problem:   Hip fracture (HCC) Active Problems:   Essential hypertension   CAD (coronary artery disease)   Hyperlipidemia   Carotid stenosis, asymptomatic, left   Protein malnutrition (HCC)   Protein-calorie malnutrition, severe   Hip fracture, right (HCC)   Lethargy   Dementia without behavioral disturbance (HCC)   SECONDARY DIAGNOSIS:   Past Medical History:  Diagnosis Date  . Arthritis   . Carotid artery occlusion   . Carotid artery occlusion   . Complication of anesthesia    difficulty waking up for 3 days after back surgery  . Hyperlipidemia   . Hypertension     HOSPITAL COURSE:   1.  Periprosthetic fracture right greater trochanter.  Seen by orthopedic surgery and this is nonsurgical management.  Tylenol as needed for pain.  Physical therapy recommended rehab.  Patient is weightbearing status as tolerated to right lower extremity.  Lovenox for 10 days to prevent DVT.  Can discontinue if any signs of bleeding. 2.  Dementia with poor appetite.  Palliative care to see at facility.  Started Remeron for appetite.  Continue Aricept and Namenda.  Continue to encourage eating and drinking Ensure. 3.  Relative hypotension.  Continue to hold antihypertensive medications. 4.  Chronic sinusitis.  Follow-up with ENT as outpatient 5.  Severe protein calorie malnutrition.  Continue ensures. 6.  Bruising right face and arms. 7.  Hyperlipidemia unspecified.   Hold statin. 8.  Urinary retention.  Foley catheter was removed on 12/16/2020 and patient did not urinate and needed to be straight cathed.  Still having urinary retention today.  We will place a Foley catheter.  Foley to be changed every 4 weeks. 9.  Uterine prolapse.  Nursing staff noticed this yesterday but reduced on its own. 10.  Palliative care to follow at the facility.  If patient declines family would consider hospice but would like to try with the rehab at this point. 11.  Glaucoma continue eyedrops 12.  Patient is a DO NOT RESUSCITATE  DISCHARGE CONDITIONS:   Fair  CONSULTS OBTAINED:  Palliative care Orthopedic surgery ENT  DRUG ALLERGIES:   Allergies  Allergen Reactions  . Codeine     Unknown  . Nsaids     Dizziness, tears up stomach  . Statins     Leg pain     DISCHARGE MEDICATIONS:   Allergies as of 12/17/2020      Reactions   Codeine    Unknown   Nsaids    Dizziness, tears up stomach   Statins    Leg pain       Medication List    STOP taking these medications   amLODipine 5 MG tablet Commonly known as: NORVASC   aspirin EC 81 MG tablet   Biotin 5000 MCG Caps   Fish Oil 1000 MG Cpdr   GARLIC PO   losartan 25 MG tablet Commonly known as: COZAAR   niacin 500 MG tablet   pravastatin 40 MG tablet Commonly known as: PRAVACHOL  TAKE these medications   acetaminophen 325 MG tablet Commonly known as: TYLENOL Take 1 tablet (325 mg total) by mouth every 6 (six) hours as needed for mild pain, fever, headache or moderate pain. What changed:  medication strength how much to take when to take this reasons to take this   donepezil 5 MG tablet Commonly known as: ARICEPT Take 5 mg by mouth at bedtime.   dorzolamide-timolol 22.3-6.8 MG/ML ophthalmic solution Commonly known as: COSOPT INSTILL 1 DROP TO BOTH EYES 2 TIMES A DAY   enoxaparin 40 MG/0.4ML injection Commonly known as: LOVENOX Inject 0.4 mLs (40 mg total) into the skin daily for  10 days.   feeding supplement Liqd Take 237 mLs by mouth 3 (three) times daily between meals.   latanoprost 0.005 % ophthalmic solution Commonly known as: XALATAN Place 1 drop into both eyes at bedtime.   melatonin 5 MG Tabs Take 1 tablet (5 mg total) by mouth at bedtime as needed.   memantine 10 MG tablet Commonly known as: NAMENDA Take 10 mg by mouth 2 (two) times daily.   mirtazapine 7.5 MG tablet Commonly known as: REMERON Take 1 tablet (7.5 mg total) by mouth at bedtime.   multivitamin with minerals Tabs tablet Take 1 tablet by mouth daily. Start taking on: December 18, 2020   Viactiv Calcium Plus D 650-12.5-40 MG-MCG-MCG Chew Generic drug: Calcium-Vitamin D-Vitamin K Chew 1 tablet by mouth 2 (two) times daily. 2 viactiv in the morning and 1 at night What changed:  medication strength how much to take        DISCHARGE INSTRUCTIONS:   Follow-up team at rehab 1 day. Follow-up palliative care at facility Follow-up ENT as outpatient  If you experience worsening of your admission symptoms, develop shortness of breath, life threatening emergency, suicidal or homicidal thoughts you must seek medical attention immediately by calling 911 or calling your MD immediately  if symptoms less severe.  You Must read complete instructions/literature along with all the possible adverse reactions/side effects for all the Medicines you take and that have been prescribed to you. Take any new Medicines after you have completely understood and accept all the possible adverse reactions/side effects.   Please note  You were cared for by a hospitalist during your hospital stay. If you have any questions about your discharge medications or the care you received while you were in the hospital after you are discharged, you can call the unit and asked to speak with the hospitalist on call if the hospitalist that took care of you is not available. Once you are discharged, your primary care physician  will handle any further medical issues. Please note that NO REFILLS for any discharge medications will be authorized once you are discharged, as it is imperative that you return to your primary care physician (or establish a relationship with a primary care physician if you do not have one) for your aftercare needs so that they can reassess your need for medications and monitor your lab values.    Today   CHIEF COMPLAINT:   Chief Complaint  Patient presents with  . Fall    HISTORY OF PRESENT ILLNESS:  Cindy Mcdonald  is a 87 y.o. female came in after a fall and found to have right trochanter fracture.   VITAL SIGNS:  Blood pressure 132/66, pulse 99, temperature 98.4 F (36.9 C), temperature source Oral, resp. rate 19, height 5\' 2"  (1.575 m), weight 47.6 kg, SpO2 97 %.  I/O:  Intake/Output Summary (Last 24 hours) at 12/17/2020 1204 Last data filed at 12/17/2020 1009 Gross per 24 hour  Intake 761.7 ml  Output 1007 ml  Net -245.3 ml    PHYSICAL EXAMINATION:  GENERAL:  87 y.o.-year-old patient lying in the bed with no acute distress.  EYES: Pupils equal, round, reactive to light and accommodation. No scleral icterus.  HEENT: Head atraumatic, normocephalic. Oropharynx and nasopharynx clear. .  LUNGS: Normal breath sounds bilaterally, no wheezing, rales,rhonchi or crepitation. No use of accessory muscles of respiration.  CARDIOVASCULAR: S1, S2 normal. No murmurs.  ABDOMEN: Soft, non-tender, non-distended.  EXTREMITIES: No pedal edema.  NEUROLOGIC: Patient was able to move her arms on her own.Marland Kitchen  PSYCHIATRIC: The patient is alert and answers a few questions but does not remember eating.Marland Kitchen  SKIN: Bruising on arms and right face.   DATA REVIEW:   CBC Recent Labs  Lab 12/16/20 0557  WBC 15.6*  HGB 9.2*  HCT 28.9*  PLT 205    Chemistries  Recent Labs  Lab 12/13/20 1527 12/14/20 0928 12/16/20 0557  NA 132*   < > 139  K 4.3   < > 4.8  CL 98   < > 106  CO2 25   < > 27   GLUCOSE 147*   < > 113*  BUN 30*   < > 24*  CREATININE 1.03*   < > 0.80  CALCIUM 9.1   < > 8.4*  AST 30  --   --   ALT 15  --   --   ALKPHOS 60  --   --   BILITOT 0.7  --   --    < > = values in this interval not displayed.    Microbiology Results  Results for orders placed or performed during the hospital encounter of 12/13/20  SARS CORONAVIRUS 2 (TAT 6-24 HRS) Nasopharyngeal Nasopharyngeal Swab     Status: None   Collection Time: 12/13/20  5:30 PM   Specimen: Nasopharyngeal Swab  Result Value Ref Range Status   SARS Coronavirus 2 NEGATIVE NEGATIVE Final    Comment: (NOTE) SARS-CoV-2 target nucleic acids are NOT DETECTED.  The SARS-CoV-2 RNA is generally detectable in upper and lower respiratory specimens during the acute phase of infection. Negative results do not preclude SARS-CoV-2 infection, do not rule out co-infections with other pathogens, and should not be used as the sole basis for treatment or other patient management decisions. Negative results must be combined with clinical observations, patient history, and epidemiological information. The expected result is Negative.  Fact Sheet for Patients: HairSlick.no  Fact Sheet for Healthcare Providers: quierodirigir.com  This test is not yet approved or cleared by the [place] FDA and  has been authorized for detection and/or diagnosis of SARS-CoV-2 by FDA under an Emergency Use Authorization (EUA). This EUA will remain  in effect (meaning this test can be used) for the duration of the COVID-19 declaration under Se ction 564(b)(1) of the Act, 21 U.S.C. section 360bbb-3(b)(1), unless the authorization is terminated or revoked sooner.  Performed at [facility], [address] [place], [place] [address]   SARS CORONAVIRUS 2 (TAT 6-24 HRS) Nasopharyngeal Nasopharyngeal Swab     Status: None   Collection Time: 12/15/20  6:05 PM   Specimen:  Nasopharyngeal Swab  Result Value Ref Range Status   SARS Coronavirus 2 NEGATIVE NEGATIVE Final    Comment: (NOTE) SARS-CoV-2 target nucleic acids are NOT DETECTED.  The SARS-CoV-2 RNA is generally detectable in  upper and lower respiratory specimens during the acute phase of infection. Negative results do not preclude SARS-CoV-2 infection, do not rule out co-infections with other pathogens, and should not be used as the sole basis for treatment or other patient management decisions. Negative results must be combined with clinical observations, patient history, and epidemiological information. The expected result is Negative.  Fact Sheet for Patients: HairSlick.nohttps://www.fda.gov/media/138098/download  Fact Sheet for Healthcare Providers: quierodirigir.comhttps://www.fda.gov/media/138095/download  This test is not yet approved or cleared by the [place] FDA and  has been authorized for detection and/or diagnosis of SARS-CoV-2 by FDA under an Emergency Use Authorization (EUA). This EUA will remain  in effect (meaning this test can be used) for the duration of the COVID-19 declaration under Se ction 564(b)(1) of the Act, 21 U.S.C. section 360bbb-3(b)(1), unless the authorization is terminated or revoked sooner.  Performed at [facility], [address] [place], [place] [id number]     RADIOLOGY:  CT HEAD WO CONTRAST  Result Date: 12/16/2020 CLINICAL DATA:  Mental status change, unknown cause. EXAM: CT HEAD WITHOUT CONTRAST TECHNIQUE: Contiguous axial images were obtained from the base of the skull through the vertex without intravenous contrast. COMPARISON:  Head and maxillofacial CTs 12/13/2020. Face MRI 12/14/2020. FINDINGS: Brain: There is no evidence of an acute infarct, intracranial hemorrhage, mass, midline shift, or extra-axial fluid collection. There is mild cerebral atrophy. Hypodensities in the cerebral white matter bilaterally are unchanged and nonspecific but compatible with mild  chronic small vessel ischemic disease. Heterogeneous hypoattenuation in the deep gray nuclei bilaterally is similar to the prior study and appears to predominantly reflect dilated perivascular spaces on MRI, possibly with a chronic lacunar infarct in the right lentiform nucleus. Vascular: Calcified atherosclerosis at the skull base. No hyperdense vessel. Skull: No fracture. Sinuses/Orbits: Chronic right maxillary and sphenoid sinusitis with bone erosion, more fully evaluated on the prior face studies. Clear mastoid air cells. Bilateral cataract extraction. Other: None. IMPRESSION: 1. No evidence of acute intracranial abnormality. 2. Mild chronic small vessel ischemic disease. 3. Chronic right maxillary and sphenoid sinus disease as described on recent maxillofacial CT and MRI. Electronically Signed   By: Sebastian AcheAllen  Grady M.D.   On: 12/16/2020 16:44    Management plans discussed with the patient, family and they are in agreement.  CODE STATUS:     Code Status Orders  (From admission, onward)         Start     Ordered   12/13/20 1818  Do not attempt resuscitation (DNR)  Continuous       Question Answer Comment  In the event of cardiac or respiratory ARREST Do not call a "code blue"   In the event of cardiac or respiratory ARREST Do not perform Intubation, CPR, defibrillation or ACLS   In the event of cardiac or respiratory ARREST Use medication by any route, position, wound care, and other measures to relive pain and suffering. May use oxygen, suction and manual treatment of airway obstruction as needed for comfort.      12/13/20 1820        Code Status History    Date Active Date Inactive Code Status Order ID Comments User Context   05/02/2018 1058 05/03/2018 1308 Full Code [id number]  Schnier, Latina CraverGregory G, MD Inpatient   Advance Care Planning Activity      TOTAL TIME TAKING CARE OF THIS PATIENT: 35 minutes.    Alford Highlandichard Mahmud Keithly M.D on 12/17/2020 at 12:04 PM  Between 7am to 6pm - Pager -  [phone]  After 6pm go to [email or web address] - password [email or web address]  [facility]  CC: Primary care physician; Jaclyn Shaggy, MD

## 2020-12-30 ENCOUNTER — Other Ambulatory Visit: Payer: Self-pay

## 2020-12-30 ENCOUNTER — Encounter: Payer: Self-pay | Admitting: Primary Care

## 2020-12-30 ENCOUNTER — Non-Acute Institutional Stay: Payer: Medicare HMO | Admitting: Primary Care

## 2020-12-30 DIAGNOSIS — E43 Unspecified severe protein-calorie malnutrition: Secondary | ICD-10-CM

## 2020-12-30 DIAGNOSIS — S72001D Fracture of unspecified part of neck of right femur, subsequent encounter for closed fracture with routine healing: Secondary | ICD-10-CM

## 2020-12-30 DIAGNOSIS — Z515 Encounter for palliative care: Secondary | ICD-10-CM

## 2020-12-30 DIAGNOSIS — I6522 Occlusion and stenosis of left carotid artery: Secondary | ICD-10-CM

## 2020-12-30 DIAGNOSIS — F0280 Dementia in other diseases classified elsewhere without behavioral disturbance: Secondary | ICD-10-CM

## 2020-12-30 NOTE — Progress Notes (Signed)
[facility] Pall[facility] Consult Note Telephone: [phone]  Fax: [phone]    Date of encounter: 12/30/20 PATIENT NAME: Cindy Mcdonald [id number]   There are no phone numbers on file. DOB: [birth year] MRN: [id number] PRIMARY CARE PROVIDER:    Albina Billet, MD,  [address]   [place] [address] [phone]  [place]:   Rica Koyanagi, [place],  [place] [address] [phone]  RESPONSIBLE PARTY:    Contact Information    Name Relation Home Work Mobile   Gus Puma Daughter   [phone]   Dionne Ano Daughter   [phone]      I met face to face with patient and family in facility. Palliative Care was asked to follow this patient by consultation request of  Rica Koyanagi, MD  to address advance care planning and complex medical decision making. This is the initial visit.    ASSESSMENT AND RECOMMENDATIONS:   1. Advance Care Planning/Goals of Care: Goals include to maximize quality of life and symptom management. Our advance care planning conversation included a discussion about:     The value and importance of advance care planning   Experiences with loved ones who have been seriously ill or have died   Exploration of personal, cultural or spiritual beliefs that might influence medical decisions   Exploration of goals of care in the event of a sudden injury or illness   Identification  of a healthcare agent - daughter [name]  Review  of an advance directive document- DNR, comfort measures  Discussion RE course of recent decline. Patient has had fx and was living alone. Has shown signs of increasing dementia which is impacting her ability to make rehab progress. Daughter voices understanding that patient may not make progress, and will not be able to return to her home. We discussed hospice as a possible choice once her therapy goals are maximized.   2.  Symptom Management:   Debility: Patient has been in rehab for several weeks,   3. Follow up Palliative Care Visit: Palliative care will continue to follow for goals of care clarification and symptom management. Return 2 weeks or prn.  4. Family /Caregiver/Community Supports: Daughter is with pt today and is POA. Was living alone prior to fall but now is in SNF and may need LTC.  5. Cognitive / Functional decline: A and o x 1. Dependent in all adls and iadls.  I spent 45 minutes providing this consultation.  More than 50% of the time in this consultation was spent in counseling and care coordination.  CODE STATUS: DNR  PPS: 30%  HOSPICE ELIGIBILITY/DIAGNOSIS: yes/ protein calorie malnutrition,  Abnormal wt loss  CHIEF COMPLAINT: debility  HISTORY OF PRESENT ILLNESS:  Cindy Mcdonald is a 87 y.o. year old female  with debility, recent fracture, dementia. Fall at home due to debility, with subsequent fracture of hip.  At rehab for strengthening but has made minimal progress. Family is pursuing LTC. We also discussed dementia disease course  In the context of debility, to brain  not being able to signal functions to the body, not just memory, and the limitations on function that brings. Patient has lost 30 lbs in 7 months. Albumin 3.5. CT reviewed from hospital stay after recent fall and fracture.  History obtained from review of EMR, discussion with primary team, and  interview with family, caregiver  and/or Ms. Lasure. Records reviewed and  summarized above.   CURRENT PROBLEM LIST:  Patient Active Problem List   Diagnosis Date Noted  . Hypotension   . Acute urinary retention   . Lethargy   . Dementia without behavioral disturbance ([place])   . Protein-calorie malnutrition, severe 12/15/2020  . Hip fracture, right ([place]) 12/15/2020  . Closed nondisplaced fracture of greater trochanter of right femur ([place]) 12/15/2020  . Status post hip hemiarthroplasty 12/15/2020  . Hip fracture ([place])  12/13/2020  . Protein malnutrition ([place]) 12/13/2020  . Carotid stenosis, asymptomatic, left 05/02/2018  . Carotid stenosis 03/08/2018  . Essential hypertension 03/08/2018  . Hyperlipidemia 03/08/2018  . CAD (coronary artery disease) 05/10/2016  . Bilateral carotid artery stenosis 05/10/2016   PAST MEDICAL HISTORY:  Active Ambulatory Problems    Diagnosis Date Noted  . Carotid stenosis 03/08/2018  . Essential hypertension 03/08/2018  . CAD (coronary artery disease) 05/10/2016  . Hyperlipidemia 03/08/2018  . Carotid stenosis, asymptomatic, left 05/02/2018  . Hip fracture ([place]) 12/13/2020  . Protein malnutrition ([place]) 12/13/2020  . Protein-calorie malnutrition, severe 12/15/2020  . Hip fracture, right ([place]) 12/15/2020  . Lethargy   . Dementia without behavioral disturbance ([place])   . Hypotension   . Acute urinary retention   . Closed nondisplaced fracture of greater trochanter of right femur ([place]) 12/15/2020  . Status post hip hemiarthroplasty 12/15/2020  . Bilateral carotid artery stenosis 05/10/2016   Resolved Ambulatory Problems    Diagnosis Date Noted  . No Resolved Ambulatory Problems   Past Medical History:  Diagnosis Date  . Arthritis   . Carotid artery occlusion   . Carotid artery occlusion   . Complication of anesthesia   . Hypertension    SOCIAL HX:  Social History   Tobacco Use  . Smoking status: Never Smoker  . Smokeless tobacco: Never Used  Substance Use Topics  . Alcohol use: Not Currently   FAMILY HX:  Family History  Problem Relation Age of Onset  . Hypertension Mother   . Heart disease Father       ALLERGIES:  Allergies  Allergen Reactions  . Codeine     Unknown  . Nsaids     Dizziness, tears up stomach  . Statins     Leg pain      PERTINENT MEDICATIONS:  Outpatient Encounter Medications as of 12/30/2020  Medication Sig  . acetaminophen (TYLENOL) 325 MG tablet Take 1 tablet (325 mg total) by mouth every 6 (six) hours as needed for mild  pain, fever, headache or moderate pain.  . Calcium-Vitamin D-Vitamin K (VIACTIV CALCIUM PLUS D) 650-12.5-40 MG-MCG-MCG CHEW Chew 1 tablet by mouth 2 (two) times daily. 2 viactiv in the morning and 1 at night  . donepezil (ARICEPT) 5 MG tablet Take 5 mg by mouth at bedtime.  . dorzolamide-timolol (COSOPT) 22.3-6.8 MG/ML ophthalmic solution INSTILL 1 DROP TO BOTH EYES 2 TIMES A DAY  . enoxaparin (LOVENOX) 40 MG/0.4ML injection Inject 0.4 mLs (40 mg total) into the skin daily for 10 days.  . feeding supplement (ENSURE ENLIVE / ENSURE PLUS) LIQD Take 237 mLs by mouth 3 (three) times daily between meals.  . latanoprost (XALATAN) 0.005 % ophthalmic solution Place 1 drop into both eyes at bedtime.   . melatonin 5 MG TABS Take 1 tablet (5 mg total) by mouth at bedtime as needed.  . memantine (NAMENDA) 10 MG tablet Take 10 mg by mouth 2 (two) times daily.  . mirtazapine (REMERON) 7.5 MG tablet Take 1 tablet (7.5 mg total) by mouth  at bedtime.  . Multiple Vitamin (MULTIVITAMIN WITH MINERALS) TABS tablet Take 1 tablet by mouth daily.   No facility-administered encounter medications on file as of 12/30/2020.    ROS per pt and family  General: NAD ENMT: denies dysphagia Cardiovascular: denies chest pain, denies DOE Pulmonary: denies cough, denies increased SOB Abdomen: endorses poor  appetite, endorses loose stools, endorses incontinence of bowel GU: denies dysuria, endorses incontinence of urine MSK:  endorses weakness,  no falls reported at SNF Skin: denies rashes or wounds Neurological: denies pain, denies insomnia Psych: Endorses flat mood  Physical Exam: Current and past weights: 30 lb loss in 7 months, now 105 lbs Constitutional:  NAD General: frail appearing, thin EYES: anicteric sclera, lids intact, no discharge  ENMT: intact hearing, oral mucous membranes moist, dentition intact CV: , no LE edema Pulmonary: no increased work of breathing, no cough, room air Abdomen: intake 25%,  no  ascites GU: deferred MSK: + sarcopenia, moves all extremities , non ambulatory Skin: warm and dry,  Bruises  on visible skin of arm Neuro:  + generalized weakness,  Advanced cognitive impairment Psych: non-anxious affect, A and O x 1-2 Hem/lymph/immuno: bruising on  R face from fall, arms.  Thank you for the opportunity to participate in the care of Ms. Jewell.  The palliative care team will continue to follow. Please call our office at [phone] if we can be of additional assistance.   Jason Coop, NP , DNP, MPH, AGPCNP-BC, ACHPN   COVID-19 PATIENT SCREENING TOOL Asked and negative response unless otherwise noted:   Have you had symptoms of covid, tested positive or been in contact with someone with symptoms/positive test in the past 5-10 days?

## 2021-01-06 ENCOUNTER — Non-Acute Institutional Stay: Payer: Medicare HMO | Admitting: Primary Care

## 2021-01-06 ENCOUNTER — Other Ambulatory Visit: Payer: Self-pay

## 2021-01-06 DIAGNOSIS — Z515 Encounter for palliative care: Secondary | ICD-10-CM

## 2021-01-06 DIAGNOSIS — S72001D Fracture of unspecified part of neck of right femur, subsequent encounter for closed fracture with routine healing: Secondary | ICD-10-CM

## 2021-01-06 DIAGNOSIS — E43 Unspecified severe protein-calorie malnutrition: Secondary | ICD-10-CM

## 2021-01-06 DIAGNOSIS — R54 Age-related physical debility: Secondary | ICD-10-CM | POA: Insufficient documentation

## 2021-01-06 DIAGNOSIS — F0390 Unspecified dementia without behavioral disturbance: Secondary | ICD-10-CM

## 2021-01-06 NOTE — Progress Notes (Signed)
[facility] Consult Note Telephone: [phone]  Fax: [phone]    Date of encounter: 01/06/21 PATIENT NAME: Cindy Mcdonald [address] [place] [address]   [phone] (home)  DOB: 13-Oct-1929 MRN: [id number] PRIMARY CARE PROVIDER:    Albina Billet, MD,  [address]   [place] [address] [phone]  REFERRING PROVIDER:   Rica Koyanagi, [place],  [place] [address] [phone]  RESPONSIBLE PARTY:    Contact Information    Name Relation Home Work Mobile   Gus Puma Daughter   [phone]   Dionne Ano Daughter   [phone]      I met face to face with patient and family in [facility]. Palliative Care was asked to follow this patient by consultation request of  Dr. Rica Koyanagi to address advance care planning and complex medical decision making. This is the follow up visit.   ASSESSMENT AND RECOMMENDATIONS:   1. Advance Care Planning/Goals of Care: Goals include to maximize quality of life and symptom management. Poa not available today, spoke with family member in from [place].  2. Symptom Management:   Malnutrition: Family member endorses very poor intake, several bites a meal. Taking the liquid nutritional supplements.  Has had a 30 lb wt loss over several months, very significant for decline syndromes  Wound management: Has sacral decubitus, not assessed but large and deep per staff report. There is an odor which may be emanating from the wound. Recommend f/u with wound management if concordant with goals of care.  3. Follow up Palliative Care Visit: Palliative care will continue to follow for advance care planning and  clarification and symptom management. Return 1-2 weeks or prn.  4. Family /Caregiver/Community Supports: POA is daughter [name], in SNF for rehab. Still receiving rehab services.  5. Cognitive / Functional decline: Severe dementia. Family states this  was never diagnosed. Education provided RE the disease trajectory of dementia and that patient is not willfully refusing to eat, stay awake, do rehab but that her body is too frail.  I spent 25 minutes providing this consultation. More than 50% of the time in this consultation was spent in counseling and care coordination.  CODE STATUS: TbD  PPS: 30%  HOSPICE ELIGIBILITY/DIAGNOSIS: yes with concordant goals of care/abnormal weight loss  Medical reason for visit: frailty, debility  HISTORY OF PRESENT ILLNESS:  Cindy Mcdonald is a 87 y.o. year old female  with h/o fx hip, dementia, frailty, debility following fall and hip repair. She also has had a 30 lb weight loss in 4 month's time. Today being assessed for response to therapy and rehabilitative services. She has days of better intake and better participation and other days with more somnolence. She does smile and speak a few words but is visibly fatigued. She has  pronounced frailty syndrome .   History obtained from review of EMR, discussion with primary team, and  interview with family, caregiver  and/or Cindy Mcdonald. Records reviewed and summarized above.   ROS  General: NAD ENMT: denies dysphagia Pulmonary: denies cough, denies increased SOB Abdomen: endorses poor intake and  appetite, denies constipation,incontinent MSK:  Endorses weakness,  no falls reported at SNF Skin: reports sacral PI Neurological: endorse some sacral pain, denies insomnia Psych: Endorses fatigue   Physical Exam: Current and past weights: 30 lb loss Constitutional: NAD, fatigued General: frail appearing, thin CV:  no LE edema Pulmonary:  no increased work of breathing,  no cough, room air MSK: ++ sarcopenia, moves all extremities, non ambulatory Skin: warm and dry, sacral decubitus not assessed Neuro:  ++ generalized weakness,  severe cognitive impairment Psych: non-anxious affect, A and O x 1  Thank you for the opportunity to participate in the care  of Cindy Mcdonald.  The palliative care team will continue to follow. Please call our office at [phone] if we can be of additional assistance.   Jason Coop, NP , DNP, MPH, AGPCNP-BC, [facility]  CURRENT PROBLEM LIST:  Patient Active Problem List   Diagnosis Date Noted  . Hypotension   . Acute urinary retention   . Lethargy   . Dementia without behavioral disturbance ([place])   . Protein-calorie malnutrition, severe 12/15/2020  . Hip fracture, right ([place]) 12/15/2020  . Closed nondisplaced fracture of greater trochanter of right femur ([place]) 12/15/2020  . Status post hip hemiarthroplasty 12/15/2020  . Hip fracture ([place]) 12/13/2020  . Protein malnutrition ([place]) 12/13/2020  . Carotid stenosis, asymptomatic, left 05/02/2018  . Carotid stenosis 03/08/2018  . Essential hypertension 03/08/2018  . Hyperlipidemia 03/08/2018  . CAD (coronary artery disease) 05/10/2016  . Bilateral carotid artery stenosis 05/10/2016   PAST MEDICAL HISTORY:  Active Ambulatory Problems    Diagnosis Date Noted  . Carotid stenosis 03/08/2018  . Essential hypertension 03/08/2018  . CAD (coronary artery disease) 05/10/2016  . Hyperlipidemia 03/08/2018  . Carotid stenosis, asymptomatic, left 05/02/2018  . Hip fracture ([place]) 12/13/2020  . Protein malnutrition ([place]) 12/13/2020  . Protein-calorie malnutrition, severe 12/15/2020  . Hip fracture, right ([place]) 12/15/2020  . Lethargy   . Dementia without behavioral disturbance ([place])   . Hypotension   . Acute urinary retention   . Closed nondisplaced fracture of greater trochanter of right femur ([place]) 12/15/2020  . Status post hip hemiarthroplasty 12/15/2020  . Bilateral carotid artery stenosis 05/10/2016   Resolved Ambulatory Problems    Diagnosis Date Noted  . No Resolved Ambulatory Problems   Past Medical History:  Diagnosis Date  . Arthritis   . Carotid artery occlusion   . Carotid artery occlusion   . Complication of anesthesia   . Hypertension     SOCIAL HX:  Social History   Tobacco Use  . Smoking status: Never Smoker  . Smokeless tobacco: Never Used  Substance Use Topics  . Alcohol use: Not Currently   FAMILY HX:  Family History  Problem Relation Age of Onset  . Hypertension Mother   . Heart disease Father       ALLERGIES:  Allergies  Allergen Reactions  . Codeine     Unknown  . Nsaids     Dizziness, tears up stomach  . Statins     Leg pain      PERTINENT MEDICATIONS:  Outpatient Encounter Medications as of 01/06/2021  Medication Sig  . acetaminophen (TYLENOL) 325 MG tablet Take 1 tablet (325 mg total) by mouth every 6 (six) hours as needed for mild pain, fever, headache or moderate pain.  . Calcium-Vitamin D-Vitamin K (VIACTIV CALCIUM PLUS D) 650-12.5-40 MG-MCG-MCG CHEW Chew 1 tablet by mouth 2 (two) times daily. 2 viactiv in the morning and 1 at night  . donepezil (ARICEPT) 5 MG tablet Take 5 mg by mouth at bedtime.  . dorzolamide-timolol (COSOPT) 22.3-6.8 MG/ML ophthalmic solution INSTILL 1 DROP TO BOTH EYES 2 TIMES A DAY  . enoxaparin (LOVENOX) 40 MG/0.4ML injection Inject 0.4 mLs (40 mg total) into the skin daily for 10 days.  . feeding supplement (ENSURE ENLIVE /  ENSURE PLUS) LIQD Take 237 mLs by mouth 3 (three) times daily between meals.  . latanoprost (XALATAN) 0.005 % ophthalmic solution Place 1 drop into both eyes at bedtime.   . melatonin 5 MG TABS Take 1 tablet (5 mg total) by mouth at bedtime as needed.  . memantine (NAMENDA) 10 MG tablet Take 10 mg by mouth 2 (two) times daily.  . mirtazapine (REMERON) 7.5 MG tablet Take 1 tablet (7.5 mg total) by mouth at bedtime.  . Multiple Vitamin (MULTIVITAMIN WITH MINERALS) TABS tablet Take 1 tablet by mouth daily.   No facility-administered encounter medications on file as of 01/06/2021.      COVID-19 PATIENT SCREENING TOOL Asked and negative response unless otherwise noted:   Have you had symptoms of covid, tested positive or been in contact with someone  with symptoms/positive test in the past 5-10 days?

## 2021-01-12 ENCOUNTER — Other Ambulatory Visit: Payer: Self-pay

## 2021-01-12 ENCOUNTER — Non-Acute Institutional Stay: Payer: Medicare HMO | Admitting: Primary Care

## 2021-01-12 DIAGNOSIS — F0390 Unspecified dementia without behavioral disturbance: Secondary | ICD-10-CM

## 2021-01-12 DIAGNOSIS — Z515 Encounter for palliative care: Secondary | ICD-10-CM

## 2021-01-12 DIAGNOSIS — S72001D Fracture of unspecified part of neck of right femur, subsequent encounter for closed fracture with routine healing: Secondary | ICD-10-CM

## 2021-01-12 DIAGNOSIS — R54 Age-related physical debility: Secondary | ICD-10-CM

## 2021-01-12 NOTE — Progress Notes (Signed)
[facility] Consult Note Telephone: [phone]  Fax: [phone]    Date of encounter: 01/12/21 PATIENT NAME: Cindy Mcdonald [address] [place] [address]   [phone] (home)  DOB: [birth year] MRN: [id number] PRIMARY CARE PROVIDER:    Rica Koyanagi, MD,  [place] [address] [phone]  REFERRING PROVIDER:   Rica Koyanagi, MD [address] [place],  [place] [address] [phone]  RESPONSIBLE PARTY:    Contact Information    Name Relation Home Work Mobile   Gus Puma Daughter   [phone]   Flinchum,Teri Daughter   [phone]      I met face to face with patient and family in [facility]. Palliative Care was asked to follow this patient by consultation request of  Rica Koyanagi, MD to address advance care planning and complex medical decision making. This is a follow up visit.                  ASSESSMENT AND PLAN / RECOMMENDATIONS:   Advance Care Planning/Goals of Care: Goals include to maximize quality of life and symptom management. Our advance care planning conversation included a discussion about:     The value and importance of advance care planning   Experiences with loved ones who have been seriously ill or have died   Exploration of personal, cultural or spiritual beliefs that might influence medical decisions   Exploration of goals of care in the event of a sudden injury or illness   Decision  to de-escalate disease focused treatments due to poor prognosis.  CODE STATUS: DNR  I met with family who requested consultation to discuss hospice services. Daughter [name] states they know now her mother has geriatric syndrome of decline, exhibited by her poor mobility, large weight loss and declining mentation. Today she has moved from SNF to LTC side.  [name] and her family would like all comfort and support measures. We discussed hospice services, and [name] states mother's wish was  to pass at the hospice [facility].   We discussed indication for admission there, for several week prognosis. This may be the current case, but patient may also survive longer. We discussed [facility] staying at [facility] and having hospice services in place. My recommendation was to proceed with in place hospice services, and when last weeks were imminent, they could discuss transitioning to hospice home if available. Patient has been moved to semi private room. Family has requested private but non available. They would like to have more freedom to congregate as they say their good byes.   I spent 25 minutes providing this consultation. More than 50% of the time in this consultation was spent in counseling and care coordination.  __________________________________________________________________________  Symptom Management/Plan:  Anorexia: Pt intake is 25 %, or less. She has had a 40 lb weight loss in 6  Months, and continues to eat minimal amounts. Taking supplemental nutrition. Recommend more nutritional supplement as tolerated.  Mood: Sleeping more per family, but able to awaken and engage in conversation with me. She is actually a bit brighter today than my visit a week ago. Appears not to be distressed.   Skin breakdown: It was reported to me by staff that she had significant break down. Today however I did examine and it is not extensive, but in the sacral area, several small ( 1 cm or less) areas of superficial break down, could be managed with zinc  20% cream.   Pain:  Has changed from 30 min prior to PT to a 8 hrs, 50 mg tramadol. Due to the half life of narcotics I would recommend giving 25 mg q 4 hrs instead, to keep her pain more managed on an ongoing basis. Periprosthetic fracture involving the RIGHT greater trochanter with mild displacement on CT of 12/13/20 which is not pinned. Pt would have a significant amount of pain with this.   Follow up Palliative Care Visit: If not admitted to hospice.  PPS:  30%  HOSPICE ELIGIBILITY/DIAGNOSIS: TBD  Chief Complaint: debility  HISTORY OF PRESENT ILLNESS:  Cindy Mcdonald is a 87 y.o. year old female  with fx of R femur s/p fall, dementia and geriatric decline syndrome. She has been at [facility] for 3 -4 weeks for rehab but unable to make progress due to her decline syndrome. She has lost weight over 4-6 months, has gone from ambulatory to bed bound and sleeping more. She has pain from her leg  fx which makes her uncomfortable, and that is relieved by tramadol. Intake is 25 % and she exhibits stage 2 sacral break down.   History obtained from review of EMR, discussion with primary team, and interview with family, facility staff/caregiver and/or Cindy Mcdonald.  I reviewed available labs, medications, imaging, studies and related documents from the EMR.  Records reviewed and summarized above.   ROS  General: NAD Cardiovascular: denies chest pain, denies DOE Pulmonary: denies cough, denies increased SOB Abdomen: endorses poorappetite, denies constipation, endorses incontinence of bowel GU: denies dysuria, endorses incontinence of urine MSK:  endorses weakness,  no falls reported Skin: sacral break down Neurological: endorses pain, endorses insomnia  Physical Exam: Current and past weights: loss of 30-40 lbs in 4-6 mos Constitutional: NAD General: frail appearing, thin ENMT: intact hearing, oral mucous membranes moist, dentition intact CV: no LE edema Pulmonary:  no increased work of breathing, no cough, room air Abdomen: intake <25%,no ascites MSK: severe sarcopenia, moves arms/ legs with great effor, non ambulatory Skin: warm and dry, no rashes or wounds on visible skin Neuro:  +generalized weakness,  severe cognitive impairment Psych: non-anxious affect, A and O x 1-2  This visit was coded based on medical decision making (MDM).  Thank you for the opportunity to participate in the care of Cindy Mcdonald.  The palliative care team will continue to  follow. Please call our office at [phone] if we can be of additional assistance.   Jason Coop, NP , DNP, MPH, AGPCNP-BC, [facility]  COVID-19 PATIENT SCREENING TOOL Asked and negative response unless otherwise noted:   Have you had symptoms of covid, tested positive or been in contact with someone with symptoms/positive test in the past 5-10 days?

## 2021-03-05 ENCOUNTER — Ambulatory Visit: Payer: Self-pay | Admitting: Urology

## 2021-05-03 DEATH — deceased

## 2022-08-01 ENCOUNTER — Encounter (INDEPENDENT_AMBULATORY_CARE_PROVIDER_SITE_OTHER): Payer: Self-pay
